# Patient Record
Sex: Male | Born: 1985
Health system: Southern US, Community
[De-identification: ages and names within clinical notes are randomized; demographics above are authoritative.]

## PROBLEM LIST (undated history)

## (undated) DIAGNOSIS — D6800 Von Willebrand disease, unspecified: Secondary | ICD-10-CM

## (undated) DIAGNOSIS — F419 Anxiety disorder, unspecified: Secondary | ICD-10-CM

## (undated) DIAGNOSIS — G43009 Migraine without aura, not intractable, without status migrainosus: Secondary | ICD-10-CM

## (undated) DIAGNOSIS — J45909 Unspecified asthma, uncomplicated: Secondary | ICD-10-CM

## (undated) DIAGNOSIS — F32A Depression, unspecified: Secondary | ICD-10-CM

## (undated) DIAGNOSIS — F329 Major depressive disorder, single episode, unspecified: Secondary | ICD-10-CM

## (undated) DIAGNOSIS — K219 Gastro-esophageal reflux disease without esophagitis: Secondary | ICD-10-CM

## (undated) HISTORY — DX: Unspecified asthma, uncomplicated: J45.909

## (undated) HISTORY — DX: Migraine without aura, not intractable, without status migrainosus: G43.009

## (undated) HISTORY — DX: Gastro-esophageal reflux disease without esophagitis: K21.9

---
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
## 2007-08-26 ENCOUNTER — Emergency Department (HOSPITAL_COMMUNITY): Admission: EM | Admit: 2007-08-26 | Discharge: 2007-08-26 | Payer: Self-pay | Admitting: Emergency Medicine

## 2007-12-29 ENCOUNTER — Emergency Department (HOSPITAL_COMMUNITY): Admission: EM | Admit: 2007-12-29 | Discharge: 2007-12-29 | Payer: Self-pay | Admitting: Emergency Medicine

## 2008-04-14 ENCOUNTER — Emergency Department (HOSPITAL_COMMUNITY): Admission: EM | Admit: 2008-04-14 | Discharge: 2008-04-14 | Payer: Self-pay | Admitting: Emergency Medicine

## 2011-07-23 LAB — DIFFERENTIAL
Basophils Absolute: 0
Basophils Relative: 0
Eosinophils Absolute: 0.1
Eosinophils Relative: 2
Lymphs Abs: 1.6
Monocytes Absolute: 0.9
Monocytes Relative: 14 — ABNORMAL HIGH
Neutro Abs: 4.2

## 2011-07-23 LAB — CBC
RBC: 4.71
RDW: 12.6

## 2014-03-25 ENCOUNTER — Encounter (HOSPITAL_COMMUNITY): Payer: Self-pay | Admitting: Emergency Medicine

## 2014-03-25 ENCOUNTER — Emergency Department (HOSPITAL_COMMUNITY): Payer: 59

## 2014-03-25 ENCOUNTER — Emergency Department (HOSPITAL_COMMUNITY)
Admission: EM | Admit: 2014-03-25 | Discharge: 2014-03-25 | Disposition: A | Discharge status: Home / Self Care | Payer: 59 | Attending: Emergency Medicine | Admitting: Emergency Medicine

## 2014-03-25 DIAGNOSIS — J45909 Unspecified asthma, uncomplicated: Secondary | ICD-10-CM | POA: Insufficient documentation

## 2014-03-25 DIAGNOSIS — R079 Chest pain, unspecified: Secondary | ICD-10-CM | POA: Insufficient documentation

## 2014-03-25 DIAGNOSIS — Z862 Personal history of diseases of the blood and blood-forming organs and certain disorders involving the immune mechanism: Secondary | ICD-10-CM | POA: Insufficient documentation

## 2014-03-25 DIAGNOSIS — R110 Nausea: Secondary | ICD-10-CM | POA: Insufficient documentation

## 2014-03-25 DIAGNOSIS — J45901 Unspecified asthma with (acute) exacerbation: Secondary | ICD-10-CM | POA: Insufficient documentation

## 2014-03-25 HISTORY — DX: Unspecified asthma, uncomplicated: J45.909

## 2014-03-25 HISTORY — DX: Von Willebrand disease, unspecified: D68.00

## 2014-03-25 HISTORY — DX: Von Willebrand's disease: D68.0

## 2014-03-25 LAB — BASIC METABOLIC PANEL
BUN: 11 mg/dL (ref 6–23)
CO2: 22 mEq/L (ref 19–32)
Calcium: 9.4 mg/dL (ref 8.4–10.5)
Chloride: 104 mEq/L (ref 96–112)
Creatinine, Ser: 1.05 mg/dL (ref 0.50–1.35)
GFR calc non Af Amer: >90 mL/min (ref >90)
Glucose, Bld: 121 mg/dL — ABNORMAL HIGH (ref 70–99)
Potassium: 4.4 mEq/L (ref 3.7–5.3)
SODIUM: 141 meq/L (ref 137–147)

## 2014-03-25 LAB — CBC
HCT: 41.5 % (ref 39.0–52.0)
Hemoglobin: 14.3 g/dL (ref 13.0–17.0)
MCH: 29.4 pg (ref 26.0–34.0)
MCHC: 34.5 g/dL (ref 30.0–36.0)
MCV: 85.4 fL (ref 78.0–100.0)
PLATELETS: 284 10*3/uL (ref 150–400)
RBC: 4.86 MIL/uL (ref 4.22–5.81)
RDW: 12.9 % (ref 11.5–15.5)
WBC: 7.9 10*3/uL (ref 4.0–10.5)

## 2014-03-25 LAB — I-STAT TROPONIN, ED
TROPONIN I, POC: 0 ng/mL (ref 0.00–0.08)
TROPONIN I, POC: 0 ng/mL (ref 0.00–0.08)

## 2014-03-25 MED ORDER — ASPIRIN 81 MG PO CHEW
324.0000 mg | CHEWABLE_TABLET | Freq: Once | ORAL | Status: AC
  Administered 2014-03-25: 324 mg via ORAL
  Filled 2014-03-25: qty 4

## 2014-03-25 MED ORDER — OXYCODONE-ACETAMINOPHEN 5-325 MG PO TABS: 1.0000 | ORAL_TABLET | ORAL | Status: DC | PRN

## 2014-03-25 NOTE — Discharge Instructions (Signed)

## 2014-03-25 NOTE — ED Notes (Signed)
Pt c/o CP x 2 weeks; has seen PCP for symptoms, had ekg and labs completed in office. Reports today CP radiated into L arm and neck associated with nausea. Also sts some SOB, but thinks that may be associated with asthma. Denies recent travel.

## 2014-03-25 NOTE — ED Notes (Signed)
Pt st's started having left chest pain over a week ago.  St's he was seen by his MD and had EKG and lab work drawn.  Pt st's chest pain has continued off and on since then.  St's today was having chest pain and became lightheaded and nauseous.

## 2014-03-25 NOTE — ED Provider Notes (Signed)
CSN: [id number]     Arrival date & time 03/25/14  1746 History   First MD Initiated Contact with Patient 03/25/14 1811     Chief Complaint  Patient presents with  . Chest Pain      Patient is a 28 y.o. male presenting with chest pain. The history is provided by the patient.  Chest Pain Pain location:  L chest Pain quality: aching   Pain radiates to:  L shoulder and neck Pain severity:  Moderate Onset quality:  Gradual Duration:  2 weeks Timing:  Intermittent Progression:  Worsening Chronicity:  New Relieved by:  Nothing Worsened by:  Nothing tried Associated symptoms: nausea and shortness of breath   Associated symptoms: no abdominal pain, no cough, no fever, no syncope and not vomiting   pt reports for past 2 weeks he has had episodes of left CP Episodes will last for up to several hours It is not exertional and not pleuritic He denies h/o CAD/PE/DVT He reports he may have hyperlipidemia He does not smoke   fam hx - negative for CAD  Past Medical History  Diagnosis Date  . Asthma   . Von Willebrand disease     History  Substance Use Topics  . Smoking status: Never Smoker   . Smokeless tobacco: Not on file  . Alcohol Use: Yes    Review of Systems  Constitutional: Negative for fever.  Respiratory: Positive for shortness of breath. Negative for cough.   Cardiovascular: Positive for chest pain. Negative for syncope.  Gastrointestinal: Positive for nausea. Negative for vomiting and abdominal pain.  Neurological: Negative for syncope.  All other systems reviewed and are negative.     Allergies  Review of patient's allergies indicates not on file.  Home Medications   Prior to Admission medications   Not on File   BP 141/55  Pulse 84  Temp(Src) 99 F (37.2 C) (Oral)  Resp 20  Ht 6\' 1"  (1.854 m)  Wt 255 lb (115.667 kg)  BMI 33.65 kg/m2  SpO2 100% Physical Exam CONSTITUTIONAL: Well developed/well nourished HEAD: Normocephalic/atraumatic EYES:  EOMI/PERRL ENMT: Mucous membranes moist NECK: supple no meningeal signs SPINE:entire spine nontender CV: S1/S2 noted, no murmurs/rubs/gallops noted LUNGS: Lungs are clear to auscultation bilaterally, no apparent distress ABDOMEN: soft, nontender, no rebound or guarding GU:no cva tenderness NEURO: Pt is awake/alert, moves all extremitiesx4 EXTREMITIES: pulses normal, full ROM, no calf tenderness, no LE edema SKIN: warm, color normal PSYCH: no abnormalities of mood noted  ED Course  Procedures  7:04 PM Pt reports CP for 2 weeks intermittently He reports seen by PCP in [place] and had imaging/labs but results not known and unable to access at this time HEART score less than 3 I doubt PE (no hypoxia, no tachycardia, no pleuritic pain) He is well appearing Will repeat ekg/troponin at 3hr interval 10:39 PM Pt improved No complaints He is well appearing Stable for d/c home I doubt ACS/PE/Dissection at this time BP 118/70  Pulse 68  Temp(Src) 98 F (36.7 C) (Oral)  Resp 18  Ht 6\' 1"  (1.854 m)  Wt 255 lb (115.667 kg)  BMI 33.65 kg/m2  SpO2 99%  Labs Review Labs Reviewed  BASIC METABOLIC PANEL - Abnormal; Notable for the following:    Glucose, Bld 121 (*)    All other components within normal limits  CBC  I-STAT TROPOININ, ED    Imaging Review Dg Chest Port 1 View  03/25/2014   CLINICAL DATA:  Left-sided chest pain  EXAM:  PORTABLE CHEST - 1 VIEW  COMPARISON:  None.  FINDINGS: Normal mediastinum and cardiac silhouette. Normal pulmonary vasculature. No evidence of effusion, infiltrate, or pneumothorax. No acute bony abnormality.  IMPRESSION: Normal chest radiograph.   Electronically Signed   By: Genevive BiStewart  Edmunds M.D.   On: 03/25/2014 18:32     EKG Interpretation   Date/Time:  Sunday Mar 25 2014 18:04:50 EDT Ventricular Rate:  81 PR Interval:  146 QRS Duration: 102 QT Interval:  369 QTC Calculation: 428 R Axis:   96 Text Interpretation:  Sinus rhythm Consider  right ventricular hypertrophy  Probable inferior infarct, old Baseline wander in lead(s) I III aVR aVL  aVF artifact noted Confirmed by Bebe ShaggyWICKLINE  MD, Isiaha Greenup ([id number]) on 03/25/2014  6:24:14 PM      Date: 03/25/2014 1751  Rate: 92  Rhythm: normal sinus rhythm  QRS Axis: normal  Intervals: normal  ST/T Wave abnormalities: normal  Conduction Disutrbances:none   EKG Interpretation  Date/Time:  Sunday Mar 25 2014 21:48:41 EDT Ventricular Rate:  62 PR Interval:  150 QRS Duration: 104 QT Interval:  405 QTC Calculation: 411 R Axis:   90 Text Interpretation:  Sinus rhythm Consider right ventricular hypertrophy No significant change since last tracing Confirmed by Bebe ShaggyWICKLINE  MD, Dorinda HillNALD ([id number]) on 03/25/2014 10:13:38 PM        MDM   Final diagnoses:  Chest pain    Nursing notes including past medical history and social history reviewed and considered in documentation xrays reviewed and considered Labs/vital reviewed and considered     Joya Gaskinsonald W Antoinette Haskett, MD 03/25/14 2239

## 2015-02-27 ENCOUNTER — Encounter: Payer: Self-pay | Admitting: Internal Medicine

## 2015-02-27 ENCOUNTER — Ambulatory Visit (INDEPENDENT_AMBULATORY_CARE_PROVIDER_SITE_OTHER): Payer: 59 | Admitting: Internal Medicine

## 2015-02-27 VITALS — BP 120/80 | HR 87 | Temp 98.0°F | Ht 73.0 in | Wt 267.0 lb

## 2015-02-27 DIAGNOSIS — J4520 Mild intermittent asthma, uncomplicated: Secondary | ICD-10-CM | POA: Diagnosis not present

## 2015-02-27 DIAGNOSIS — G43009 Migraine without aura, not intractable, without status migrainosus: Secondary | ICD-10-CM | POA: Insufficient documentation

## 2015-02-27 DIAGNOSIS — J45909 Unspecified asthma, uncomplicated: Secondary | ICD-10-CM | POA: Insufficient documentation

## 2015-02-27 DIAGNOSIS — K2100 Gastro-esophageal reflux disease with esophagitis, without bleeding: Secondary | ICD-10-CM

## 2015-02-27 DIAGNOSIS — Z0000 Encounter for general adult medical examination without abnormal findings: Secondary | ICD-10-CM | POA: Diagnosis not present

## 2015-02-27 DIAGNOSIS — D6800 Von Willebrand disease, unspecified: Secondary | ICD-10-CM | POA: Insufficient documentation

## 2015-02-27 DIAGNOSIS — K219 Gastro-esophageal reflux disease without esophagitis: Secondary | ICD-10-CM | POA: Insufficient documentation

## 2015-02-27 MED ORDER — MONTELUKAST SODIUM 10 MG PO TABS: 10.0000 mg | ORAL_TABLET | Freq: Every day | ORAL | Status: DC

## 2015-02-27 NOTE — Assessment & Plan Note (Signed)
Seems to be cause of persistent chest pain Will increase the omeprazole to bid Change to another PPI if persistent symptoms

## 2015-02-27 NOTE — Progress Notes (Signed)
Subjective:    Patient ID: Eric Norris, male    DOB: Aug 05, 1986, 29 y.o.   MRN: [id number]  HPI Here to establish and for physical  Having some bad episodes of chest pain for the past year Has had CXR, stress test---all negative Intermittent but every day Goes along with a sour stomach Gets bad substernal heartburn if he misses the omeprazole Doesn't awaken him but present most mornings No trouble swallowing  Not much exercise Has gained 80# since starting at [employer] No longer doing physical work  Asthma history Has nebulizer from the past Rarely needs the albuterol in summer Uses the albuterol regularly when it is cold--every day (discussed using spacer) Cats also get him going  Current Outpatient Prescriptions on File Prior to Visit  Medication Sig Dispense Refill  . albuterol (PROAIR HFA) 108 (90 BASE) MCG/ACT inhaler Inhale 2 puffs into the lungs 2 (two) times daily as needed for wheezing or shortness of breath.    Marland Kitchen. albuterol (PROVENTIL) (2.5 MG/3ML) 0.083% nebulizer solution Take 0.625-1.25 mg by nebulization daily as needed for wheezing or shortness of breath.     No current facility-administered medications on file prior to visit.    No Known Allergies  Past Medical History  Diagnosis Date  . Asthma   . Von Willebrand disease   . GERD (gastroesophageal reflux disease)   . Cold-induced asthma   . Von Willebrand disease     No past surgical history on file.  Family History  Problem Relation Age of Onset  . Ulcerative colitis Sister     History   Social History  . Marital Status: Married    Spouse Name: N/A  . Number of Children: 1  . Years of Education: N/A   Occupational History  . Service technician     [employer]   Social History Main Topics  . Smoking status: Never Smoker   . Smokeless tobacco: Never Used  . Alcohol Use: 0.0 oz/week    0 Standard drinks or equivalent per week  . Drug Use: No  . Sexual Activity: Not on file    Other Topics Concern  . Not on file   Social History Narrative   Review of Systems  Constitutional: Positive for unexpected weight change. Negative for fatigue.       Wears seat belt  HENT: Negative for dental problem, hearing loss, tinnitus and trouble swallowing.        Overdue for dentist  Eyes: Negative for visual disturbance.       No diplopia or unilateral vision loss  Respiratory: Negative for cough, chest tightness and shortness of breath.   Cardiovascular: Positive for chest pain. Negative for palpitations and leg swelling.  Gastrointestinal: Negative for nausea, vomiting, abdominal pain, constipation and blood in stool.  Genitourinary: Negative for urgency, frequency and difficulty urinating.       No sexual problems  Musculoskeletal: Positive for back pain. Negative for joint swelling and arthralgias.  Skin: Negative for rash.  Allergic/Immunologic: Positive for environmental allergies. Negative for immunocompromised state.       Animals  Neurological: Positive for headaches. Negative for dizziness, syncope, weakness, light-headedness and numbness.       Occasional migraine---Goody's and lying in dark will help Restless legs at times--compression socks helps  Psychiatric/Behavioral: Negative for sleep disturbance and dysphoric mood. The patient is not nervous/anxious.        Objective:   Physical Exam  Constitutional: He is oriented to person, place,  and time. He appears well-developed and well-nourished. No distress.  HENT:  Head: Normocephalic and atraumatic.  Right Ear: External ear normal.  Left Ear: External ear normal.  Mouth/Throat: Oropharynx is clear and moist. No oropharyngeal exudate.  Eyes: Conjunctivae and EOM are normal. Pupils are equal, round, and reactive to light.  Neck: Normal range of motion. Neck supple. No thyromegaly present.  Cardiovascular: Normal rate, regular rhythm, normal heart sounds and intact distal pulses.  Exam reveals no gallop.    No murmur heard. Pulmonary/Chest: Effort normal and breath sounds normal. No respiratory distress. He has no wheezes. He has no rales.  Abdominal: Soft. There is no tenderness.  Musculoskeletal: He exhibits no edema or tenderness.  Lymphadenopathy:    He has no cervical adenopathy.  Neurological: He is alert and oriented to person, place, and time.  Skin: No rash noted. No erythema.  Psychiatric: He has a normal mood and affect. His behavior is normal.          Assessment & Plan:

## 2015-02-27 NOTE — Assessment & Plan Note (Signed)
Healthy but needs to work on fitness 

## 2015-02-27 NOTE — Assessment & Plan Note (Signed)
Uses the albuterol a lot in cold weather Will try montelukast for then (or pet exposure)

## 2015-02-27 NOTE — Patient Instructions (Signed)
Please try the montelukast nightly during the winter--or if you are having cold exposure. Increase the omeprazole to twice a day--in the morning on an empty stomach and at bedtime. Don't eat close to bedtime and raise the head of your bed. If the chest pain is no better within a month, let me know and I will change your medication.

## 2015-02-27 NOTE — Progress Notes (Signed)
Pre visit review using our clinic review tool, if applicable. No additional management support is needed unless otherwise documented below in the visit note. 

## 2015-04-06 ENCOUNTER — Encounter (HOSPITAL_COMMUNITY): Payer: Self-pay | Admitting: Emergency Medicine

## 2015-04-06 ENCOUNTER — Emergency Department (HOSPITAL_COMMUNITY)
Admission: EM | Admit: 2015-04-06 | Discharge: 2015-04-07 | Disposition: A | Discharge status: Home / Self Care | Payer: 59 | Attending: Emergency Medicine | Admitting: Emergency Medicine

## 2015-04-06 DIAGNOSIS — Z8679 Personal history of other diseases of the circulatory system: Secondary | ICD-10-CM | POA: Diagnosis not present

## 2015-04-06 DIAGNOSIS — J45909 Unspecified asthma, uncomplicated: Secondary | ICD-10-CM | POA: Diagnosis not present

## 2015-04-06 DIAGNOSIS — Z79899 Other long term (current) drug therapy: Secondary | ICD-10-CM | POA: Insufficient documentation

## 2015-04-06 DIAGNOSIS — F419 Anxiety disorder, unspecified: Secondary | ICD-10-CM | POA: Diagnosis not present

## 2015-04-06 DIAGNOSIS — R079 Chest pain, unspecified: Secondary | ICD-10-CM | POA: Insufficient documentation

## 2015-04-06 DIAGNOSIS — Z862 Personal history of diseases of the blood and blood-forming organs and certain disorders involving the immune mechanism: Secondary | ICD-10-CM | POA: Diagnosis not present

## 2015-04-06 DIAGNOSIS — K219 Gastro-esophageal reflux disease without esophagitis: Secondary | ICD-10-CM | POA: Diagnosis not present

## 2015-04-06 DIAGNOSIS — M542 Cervicalgia: Secondary | ICD-10-CM | POA: Diagnosis not present

## 2015-04-06 LAB — I-STAT TROPONIN, ED: Troponin i, poc: 0 ng/mL (ref 0.00–0.08)

## 2015-04-06 LAB — CBC WITH DIFFERENTIAL/PLATELET
Basophils Absolute: 0.1 10*3/uL (ref 0.0–0.1)
Basophils Relative: 1 % (ref 0–1)
Eosinophils Absolute: 0.4 10*3/uL (ref 0.0–0.7)
Eosinophils Relative: 5 % (ref 0–5)
HEMATOCRIT: 42 % (ref 39.0–52.0)
Hemoglobin: 14.3 g/dL (ref 13.0–17.0)
LYMPHS ABS: 4.1 10*3/uL — AB (ref 0.7–4.0)
LYMPHS PCT: 44 % (ref 12–46)
MCH: 28.9 pg (ref 26.0–34.0)
MCHC: 34 g/dL (ref 30.0–36.0)
MCV: 85 fL (ref 78.0–100.0)
Monocytes Absolute: 0.6 10*3/uL (ref 0.1–1.0)
Monocytes Relative: 7 % (ref 3–12)
NEUTROS ABS: 3.9 10*3/uL (ref 1.7–7.7)
Neutrophils Relative %: 43 % (ref 43–77)
PLATELETS: 321 10*3/uL (ref 150–400)
RBC: 4.94 MIL/uL (ref 4.22–5.81)
RDW: 13 % (ref 11.5–15.5)
WBC: 9.1 10*3/uL (ref 4.0–10.5)

## 2015-04-06 LAB — BASIC METABOLIC PANEL
Anion gap: 11 (ref 5–15)
BUN: 15 mg/dL (ref 6–20)
CHLORIDE: 99 mmol/L — AB (ref 101–111)
CO2: 25 mmol/L (ref 22–32)
Calcium: 8.8 mg/dL — ABNORMAL LOW (ref 8.9–10.3)
Creatinine, Ser: 1.1 mg/dL (ref 0.61–1.24)
GFR calc Af Amer: >60 mL/min (ref >60)
GFR calc non Af Amer: >60 mL/min (ref >60)
Glucose, Bld: 107 mg/dL — ABNORMAL HIGH (ref 65–99)
Potassium: 3.7 mmol/L (ref 3.5–5.1)
Sodium: 135 mmol/L (ref 135–145)

## 2015-04-06 MED ORDER — SUCRALFATE 1 G PO TABS
1.0000 g | ORAL_TABLET | Freq: Once | ORAL | Status: AC
  Administered 2015-04-06: 1 g via ORAL
  Filled 2015-04-06: qty 1

## 2015-04-06 NOTE — ED Provider Notes (Signed)
CSN: [id number]     Arrival date & time 04/06/15  2233 History  This chart was scribed for non-physician practitioner, Arman Filter, NP, working with Samuel Jester, DO, by Roxy Cedar ED Scribe. This patient was seen in room A01C/A01C and the patient's care was started at 11:11 PM   Chief Complaint  Patient presents with  . Chest Pain    The patient said he has had chest pain for a year. Today it is different, he has nausea, dizziness, lightheadedness, neck pain.     Patient is a 29 y.o. male presenting with chest pain. The history is provided by the patient. No language interpreter was used.  Chest Pain Associated symptoms: nausea    HPI Comments: Eric Norris is a 29 y.o. male with a PMHx of asthma, GERD and Von Willebrand disease, who presents to the Emergency Department complaining of moderate "continuous" chest pain that began 1 year ago. He states that his left sided chest pain today was different. He states he took BC powder at 9pm today and reports associated onset of neck pain and nausea. Patient is seen by Dr. Providence Lanius and is prescribed omeprazole medication for GERD which he takes bid. He states that his symptoms today feel different than at baseline. He reports that he was unable to sleep tonight due to increased chest pain. He states he has had an EKG and heart stress test in the past year. Temperature or heat are not modifying factors. 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   Past Medical History  Diagnosis Date  . Asthma   . Von Willebrand disease   . GERD (gastroesophageal reflux disease)   . Cold-induced asthma   . Von Willebrand disease   . Migraine without aura    History reviewed. No pertinent past surgical history. Family History  Problem Relation Age of Onset  . Ulcerative colitis Sister    History  Substance Use Topics  . Smoking status: Never Smoker   . Smokeless tobacco: Never Used  . Alcohol Use:  0.0 oz/week    0 Standard drinks or equivalent per week   Review of Systems  Cardiovascular: Positive for chest pain.  Gastrointestinal: Positive for nausea.  Musculoskeletal: Positive for neck pain.  All other systems reviewed and are negative.  Allergies  Review of patient's allergies indicates no known allergies.  Home Medications   Prior to Admission medications   Medication Sig Start Date End Date Taking? Authorizing Provider  albuterol (PROAIR HFA) 108 (90 BASE) MCG/ACT inhaler Inhale 2 puffs into the lungs 2 (two) times daily as needed for wheezing or shortness of breath.   Yes Historical Provider, MD  albuterol (PROVENTIL) (2.5 MG/3ML) 0.083% nebulizer solution Take 0.625-1.25 mg by nebulization daily as needed for wheezing or shortness of breath.   Yes Historical Provider, MD  omeprazole (PRILOSEC) 20 MG capsule Take 20 mg by mouth 2 (two) times daily.  02/17/15  Yes Historical Provider, MD  montelukast (SINGULAIR) 10 MG tablet Take 1 tablet (10 mg total) by mouth at bedtime. Patient not taking: Reported on 04/06/2015 02/27/15   Karie Schwalbe, MD  sucralfate (CARAFATE) 1 G tablet Take 1 tablet (1 g total) by mouth 4 (four) times daily -  with meals and at bedtime. 04/07/15   Earley Favor, NP   Triage Vitals: BP 135/75 mmHg  Pulse 85  Temp(Src) 98.2 F (36.8 C) (Oral)  Resp 16  Ht 6\' 1"  (1.854  m)  Wt 267 lb 1.6 oz (121.156 kg)  BMI 35.25 kg/m2  SpO2 98%  Physical Exam  Constitutional: He is oriented to person, place, and time. He appears well-developed and well-nourished. No distress.  HENT:  Head: Normocephalic and atraumatic.  Eyes: Conjunctivae and EOM are normal.  Neck: Neck supple. No tracheal deviation present.  Cardiovascular: Normal rate, regular rhythm and normal heart sounds.   Pulmonary/Chest: Effort normal and breath sounds normal. No respiratory distress.  Abdominal: Soft. Bowel sounds are normal.  Musculoskeletal: Normal range of motion. He exhibits no  edema.  Neurological: He is alert and oriented to person, place, and time.  Skin: Skin is warm and dry.  Psychiatric: He has a normal mood and affect. His behavior is normal.  Nursing note and vitals reviewed.  ED Course  Procedures (including critical care time)  DIAGNOSTIC STUDIES: Oxygen Saturation is 98% on RA, normal by my interpretation.    COORDINATION OF CARE: 11:18 PM- Discussed plans to order diagnostic EKG and lab work. Pt advised of plan for treatment and pt agrees.  Labs Review Labs Reviewed  BASIC METABOLIC PANEL - Abnormal; Notable for the following:    Chloride 99 (*)    Glucose, Bld 107 (*)    Calcium 8.8 (*)    All other components within normal limits  CBC WITH DIFFERENTIAL/PLATELET - Abnormal; Notable for the following:    Lymphs Abs 4.1 (*)    All other components within normal limits  I-STAT TROPOININ, ED   Imaging Review No results found.   EKG Interpretation   Date/Time:  Saturday April 06 2015 22:37:50 EDT Ventricular Rate:  92 PR Interval:  134 QRS Duration: 94 QT Interval:  366 QTC Calculation: 452 R Axis:   79 Text Interpretation:  Normal sinus rhythm Normal ECG When compared with  ECG of 03/25/2014 No significant change was found Confirmed by [facility]   MD, Nicholos Johns [phone]) on 04/06/2015 11:58:23 PM    refused chest xray feels better after Carafate Will FU with PCP MDM   Final diagnoses:  Chest pain  Anxiety  Gastroesophageal reflux disease without esophagitis    I personally performed the services described in this documentation, which was scribed in my presence. The recorded information has been reviewed and is accurate.  Earley Favor, NP 04/07/15 0150  Samuel Jester, DO 04/10/15 1424

## 2015-04-06 NOTE — ED Notes (Signed)
The patient said he has had chest pain for a year. Today it is different, he has nausea, dizziness, lightheadedness, neck pain.   The patient said today the chest pain is different and that is why he is here.  He rate his pain 3/10.

## 2015-04-07 MED ORDER — SUCRALFATE 1 G PO TABS: 1.0000 g | ORAL_TABLET | Freq: Three times a day (TID) | ORAL | Status: DC

## 2015-04-07 NOTE — ED Notes (Signed)
Pt. Refused Xray, states he will follow up with PCP because he thinks it was just an anxiety attack

## 2015-04-07 NOTE — Discharge Instructions (Signed)
Chest Pain (Nonspecific) °It is often hard to give a specific diagnosis for the cause of chest pain. There is always a chance that your pain could be related to something serious, such as a heart attack or a blood clot in the lungs. You need to follow up with your health care provider for further evaluation. °CAUSES  °· Heartburn. °· Pneumonia or bronchitis. °· Anxiety or stress. °· Inflammation around your heart (pericarditis) or lung (pleuritis or pleurisy). °· A blood clot in the lung. °· A collapsed lung (pneumothorax). It can develop suddenly on its own (spontaneous pneumothorax) or from trauma to the chest. °· Shingles infection (herpes zoster virus). °The chest wall is composed of bones, muscles, and cartilage. Any of these can be the source of the pain. °· The bones can be bruised by injury. °· The muscles or cartilage can be strained by coughing or overwork. °· The cartilage can be affected by inflammation and become sore (costochondritis). °DIAGNOSIS  °Lab tests or other studies may be needed to find the cause of your pain. Your health care provider may have you take a test called an ambulatory electrocardiogram (ECG). An ECG records your heartbeat patterns over a 24-hour period. You may also have other tests, such as: °· Transthoracic echocardiogram (TTE). During echocardiography, sound waves are used to evaluate how blood flows through your heart. °· Transesophageal echocardiogram (TEE). °· Cardiac monitoring. This allows your health care provider to monitor your heart rate and rhythm in real time. °· Holter monitor. This is a portable device that records your heartbeat and can help diagnose heart arrhythmias. It allows your health care provider to track your heart activity for several days, if needed. °· Stress tests by exercise or by giving medicine that makes the heart beat faster. °TREATMENT  °· Treatment depends on what may be causing your chest pain. Treatment may include: °¨ Acid blockers for  heartburn. °¨ Anti-inflammatory medicine. °¨ Pain medicine for inflammatory conditions. °¨ Antibiotics if an infection is present. °· You may be advised to change lifestyle habits. This includes stopping smoking and avoiding alcohol, caffeine, and chocolate. °· You may be advised to keep your head raised (elevated) when sleeping. This reduces the chance of acid going backward from your stomach into your esophagus. °Most of the time, nonspecific chest pain will improve within 2-3 days with rest and mild pain medicine.  °HOME CARE INSTRUCTIONS  °· If antibiotics were prescribed, take them as directed. Finish them even if you start to feel better. °· For the next few days, avoid physical activities that bring on chest pain. Continue physical activities as directed. °· Do not use any tobacco products, including cigarettes, chewing tobacco, or electronic cigarettes. °· Avoid drinking alcohol. °· Only take medicine as directed by your health care provider. °· Follow your health care provider's suggestions for further testing if your chest pain does not go away. °· Keep any follow-up appointments you made. If you do not go to an appointment, you could develop lasting (chronic) problems with pain. If there is any problem keeping an appointment, call to reschedule. °SEEK MEDICAL CARE IF:  °· Your chest pain does not go away, even after treatment. °· You have a rash with blisters on your chest. °· You have a fever. °SEEK IMMEDIATE MEDICAL CARE IF:  °· You have increased chest pain or pain that spreads to your arm, neck, jaw, back, or abdomen. °· You have shortness of breath. °· You have an increasing cough, or you cough   up blood.  You have severe back or abdominal pain.  You feel nauseous or vomit.  You have severe weakness.  You faint.  You have chills. This is an emergency. Do not wait to see if the pain will go away. Get medical help at once. Call your local emergency services (911 in [place] Do not drive  yourself to the hospital. MAKE SURE YOU:   Understand these instructions.  Will watch your condition.  Will get help right away if you are not doing well or get worse. Document Released: 07/22/2005 Document Revised: 10/17/2013 Document Reviewed: 05/17/2008 [facility] Patient Information 2015 [place], [place]. This information is not intended to replace advice given to you by your health care provider. Make sure you discuss any questions you have with your health care provider. Try the Carafate on a regular basis for the next week to see if your symptoms improve  If you do this stop the Prilosec Follow up with your PCP

## 2015-04-07 NOTE — ED Notes (Signed)
Pt. Left with all belongings and refused wheelchair 

## 2015-04-08 ENCOUNTER — Ambulatory Visit: Payer: 59 | Admitting: Primary Care

## 2015-04-12 ENCOUNTER — Ambulatory Visit: Payer: 59 | Admitting: Internal Medicine

## 2015-07-30 ENCOUNTER — Ambulatory Visit (INDEPENDENT_AMBULATORY_CARE_PROVIDER_SITE_OTHER): Payer: Commercial Managed Care - HMO

## 2015-07-30 DIAGNOSIS — Z23 Encounter for immunization: Secondary | ICD-10-CM | POA: Diagnosis not present

## 2015-10-09 ENCOUNTER — Telehealth: Payer: Self-pay | Admitting: Internal Medicine

## 2015-10-09 ENCOUNTER — Encounter: Payer: Self-pay | Admitting: Primary Care

## 2015-10-09 ENCOUNTER — Ambulatory Visit (INDEPENDENT_AMBULATORY_CARE_PROVIDER_SITE_OTHER): Payer: Commercial Managed Care - HMO | Admitting: Primary Care

## 2015-10-09 VITALS — BP 122/76 | HR 88 | Temp 98.4°F | Ht 73.0 in | Wt 277.8 lb

## 2015-10-09 DIAGNOSIS — F411 Generalized anxiety disorder: Secondary | ICD-10-CM

## 2015-10-09 DIAGNOSIS — R079 Chest pain, unspecified: Secondary | ICD-10-CM | POA: Diagnosis not present

## 2015-10-09 MED ORDER — ESCITALOPRAM OXALATE 10 MG PO TABS: 10.0000 mg | ORAL_TABLET | Freq: Every day | ORAL | Status: DC

## 2015-10-09 NOTE — Patient Instructions (Addendum)
Your ECG looks good and is unchanged from your prior ECG.  Start Lexapro tablets for anxiety. Take 1/2 tablet by mouth daily for 6 days, then advance to 1 full tablet thereafter.  It takes 4-6 weeks for this medication to take full effect, but you should notice a difference in the next several days.  Please follow up with either myself or your PCP in 4 to 6 weeks.  It was a pleasure meeting you!

## 2015-10-09 NOTE — Progress Notes (Signed)
Pre visit review using our clinic review tool, if applicable. No additional management support is needed unless otherwise documented below in the visit note. 

## 2015-10-09 NOTE — Telephone Encounter (Signed)
Patient Name: Eric Norris  DOB: Aug 21, 1986    Initial Comment Caller states he is having chest pain. Sometimes goes into his neck and arm. Having stomach issues.   Nurse Assessment  Nurse: Vickey SagesAtkins, RN, Jacquilin Date/Time (Eastern Time): 10/09/2015 9:02:09 AM  Confirm and document reason for call. If symptomatic, describe symptoms. ---Caller states he is having chest pain. Sometimes goes into his neck and arm. Having stomach issues.- Caller states he has had chest pain for 2+ years. Pain started going into his arm/neck about 2-3 months ago. Dizziness off and on X months with the chest pain. Patient states he has been seen for the issue but its not getting better  Has the patient traveled out of the country within the last 30 days? ---Not Applicable  Does the patient have any new or worsening symptoms? ---Yes  Will a triage be completed? ---Yes  Related visit to physician within the last 2 weeks? ---No  Does the PT have any chronic conditions? (i.e. diabetes, asthma, etc.) ---Yes  List chronic conditions. ---asthma  Is this a behavioral health or substance abuse call? ---No

## 2015-10-09 NOTE — Progress Notes (Signed)
Subjective:    Patient ID: Eric Norris, male    DOB: [birth year], 30 y.o.   MRN: [id number]  HPI  Mr. Gignac is a 29 year old male with a history of asthma, GERD, and Von Willlebrand disease who presents today with a chief complaint of chest pain. His chest pain has been intermittent for several years and is located to the anterior chest wall bilaterally.   He's sought treatment for his symptoms in the past.  He also has pain to his shoulders and neck that has been present intermittently since this summer. He describes his pain as "squeezing/pressure", sometimes sharp, sometimes dull.   His most recent episode began last night. He began to feel lightheaded and dizzy this morning.  He's had some nausea and abdominal discomfort. He's had numerous ECG's that were unremarkable. He completed a stress test that was in August 2016 through [facility] which was normal [per patient.  His chest pain causes him to worry daily. He endorses difficulty controlling his worry, has irritability. GAD 7 score of 21 today. He works as a Scientist, water quality. He was seeing a therapist 2-3 months ago and didn't feel as though it helped. He denies depression symptoms.  Review of Systems  Respiratory: Negative for shortness of breath.   Cardiovascular: Positive for chest pain. Negative for palpitations.  Gastrointestinal: Positive for nausea. Negative for vomiting.  Neurological: Negative for headaches.  Psychiatric/Behavioral: Positive for sleep disturbance. Negative for suicidal ideas. The patient is nervous/anxious.        Past Medical History  Diagnosis Date  . Asthma   . Von Willebrand disease (HCC)   . GERD (gastroesophageal reflux disease)   . Cold-induced asthma   . Von Willebrand disease (HCC)   . Migraine without aura     Social History   Social History  . Marital Status: Married    Spouse Name: N/A  . Number of Children: 1  . Years of Education: N/A    Occupational History  . Service technician     [employer]   Social History Main Topics  . Smoking status: Never Smoker   . Smokeless tobacco: Never Used  . Alcohol Use: 0.0 oz/week    0 Standard drinks or equivalent per week  . Drug Use: No  . Sexual Activity: Not on file   Other Topics Concern  . Not on file   Social History Narrative    No past surgical history on file.  Family History  Problem Relation Age of Onset  . Ulcerative colitis Sister     No Known Allergies  Current Outpatient Prescriptions on File Prior to Visit  Medication Sig Dispense Refill  . albuterol (PROAIR HFA) 108 (90 BASE) MCG/ACT inhaler Inhale 2 puffs into the lungs 2 (two) times daily as needed for wheezing or shortness of breath.    Marland Kitchen albuterol (PROVENTIL) (2.5 MG/3ML) 0.083% nebulizer solution Take 0.625-1.25 mg by nebulization daily as needed for wheezing or shortness of breath.    . montelukast (SINGULAIR) 10 MG tablet Take 1 tablet (10 mg total) by mouth at bedtime. 30 tablet 11  . omeprazole (PRILOSEC) 20 MG capsule Take 20 mg by mouth 2 (two) times daily.      No current facility-administered medications on file prior to visit.    BP 122/76 mmHg  Pulse 88  Temp(Src) 98.4 F (36.9 C) (Oral)  Ht  (1.854 m)  Wt 277 lb 12.8 oz (126.009 kg)  BMI 36.66  kg/m2  SpO2 98%    Objective:   Physical Exam  Constitutional: He appears well-nourished.  Cardiovascular: Normal rate and regular rhythm.   Pulmonary/Chest: Effort normal and breath sounds normal.  Skin: Skin is warm and dry.  Psychiatric: He has a normal mood and affect.          Assessment & Plan:  Chest pain:   Chest pain with abdominal discomfort and nausea intermittently for years, recent episode Monday this week. ECG's reviewed and are unremarkable. ECG today unchanged from prior in June 2016.  Normal stress test in August 2016.  After a long discussion and assessment of symptoms, suspect root cause of  symptoms are related to anxiety. GAD 7 score of 21 today. No depressive symptoms.  Will do TSH, CBC, CMP today to rule out other metabolic causes. Otherwise I highly suspect he has untreated GAD.  ECG: NSR rate of 77. Unchanged when compared to prior ECG in June 2016. No ST elevation/depression, inverted t-waves, PVC, PAC's.

## 2015-10-09 NOTE — Assessment & Plan Note (Signed)
Chest pain with abdominal discomfort and nausea intermittently for years, recent episode Monday this week. ECG's reviewed and are unremarkable. ECG today unchanged from prior in June 2016.  Normal stress test in August 2016.  After a long discussion and assessment of symptoms, suspect root cause of symptoms are related to anxiety. GAD 7 score of 21 today. No depressive symptoms. Will treat with Lexapro 10 mg and therapy.  Patient is to take 1/2 tablet daily for 6 days, then advance to 1 full tablet thereafter. We discussed possible side effects of headache, GI upset, drowsiness, and SI/HI. If thoughts of SI/HI develop, we discussed to present to the emergency immediately. Patient verbalized understanding.   He is to follow up in 4-6 weeks for re-evaluation.

## 2015-10-09 NOTE — Telephone Encounter (Signed)
Spoke with pt; pt had CP for 2 years; pt has had stress testing done. For 5-6 months pt has had pain going into neck and arm. Today pt having CP with pain going into neck; pain level 2-3. Pt cannot come to office until after 2 pm today. Pt scheduled appt with Mayra ReelKate Clark NP today at 2:45 pm. Pt agrees if pain changes or worsens or develops SOB will go to UC or ED for eval. Pt is not completely sure he can make todays appt and will keep appt with Dr Alphonsus SiasLetvak for 10/10/15 until sure will be able to make todays appt.

## 2015-10-10 ENCOUNTER — Ambulatory Visit: Payer: Self-pay | Admitting: Internal Medicine

## 2015-10-10 LAB — COMPREHENSIVE METABOLIC PANEL
ALBUMIN: 4.3 g/dL (ref 3.5–5.2)
ALT: 25 U/L (ref 0–53)
AST: 18 U/L (ref 0–37)
Alkaline Phosphatase: 71 U/L (ref 39–117)
BUN: 13 mg/dL (ref 6–23)
CHLORIDE: 103 meq/L (ref 96–112)
CO2: 28 meq/L (ref 19–32)
CREATININE: 1.12 mg/dL (ref 0.40–1.50)
Calcium: 9.3 mg/dL (ref 8.4–10.5)
GFR: 82.14 mL/min (ref >60.00)
Glucose, Bld: 98 mg/dL (ref 70–99)
Potassium: 4.2 mEq/L (ref 3.5–5.1)
SODIUM: 138 meq/L (ref 135–145)
Total Bilirubin: 0.3 mg/dL (ref 0.2–1.2)
Total Protein: 7.4 g/dL (ref 6.0–8.3)

## 2015-10-10 LAB — CBC
HCT: 44.5 % (ref 39.0–52.0)
Hemoglobin: 14.7 g/dL (ref 13.0–17.0)
MCHC: 33 g/dL (ref 30.0–36.0)
MCV: 86.1 fl (ref 78.0–100.0)
Platelets: 325 10*3/uL (ref 150.0–400.0)
RBC: 5.16 Mil/uL (ref 4.22–5.81)
RDW: 13.6 % (ref 11.5–15.5)
WBC: 10.4 10*3/uL (ref 4.0–10.5)

## 2015-10-10 LAB — TSH: TSH: 3.18 u[IU]/mL (ref 0.35–4.50)

## 2015-10-29 ENCOUNTER — Ambulatory Visit: Payer: Commercial Managed Care - HMO | Admitting: Psychology

## 2015-11-14 ENCOUNTER — Ambulatory Visit: Payer: Commercial Managed Care - HMO | Admitting: Internal Medicine

## 2015-11-18 ENCOUNTER — Ambulatory Visit: Payer: Commercial Managed Care - HMO | Admitting: Internal Medicine

## 2015-11-26 ENCOUNTER — Ambulatory Visit (INDEPENDENT_AMBULATORY_CARE_PROVIDER_SITE_OTHER): Payer: 59 | Admitting: Psychology

## 2015-11-26 DIAGNOSIS — F410 Panic disorder [episodic paroxysmal anxiety] without agoraphobia: Secondary | ICD-10-CM | POA: Diagnosis not present

## 2015-12-02 ENCOUNTER — Encounter: Payer: Self-pay | Admitting: Internal Medicine

## 2015-12-02 ENCOUNTER — Ambulatory Visit (INDEPENDENT_AMBULATORY_CARE_PROVIDER_SITE_OTHER): Payer: Commercial Managed Care - HMO | Admitting: Internal Medicine

## 2015-12-02 VITALS — BP 110/80 | HR 88 | Temp 98.3°F | Wt 273.0 lb

## 2015-12-02 DIAGNOSIS — F411 Generalized anxiety disorder: Secondary | ICD-10-CM | POA: Diagnosis not present

## 2015-12-02 MED ORDER — ESCITALOPRAM OXALATE 10 MG PO TABS: 10.0000 mg | ORAL_TABLET | Freq: Every day | ORAL | Status: DC

## 2015-12-02 NOTE — Progress Notes (Signed)
Pre visit review using our clinic review tool, if applicable. No additional management support is needed unless otherwise documented below in the visit note. 

## 2015-12-02 NOTE — Assessment & Plan Note (Signed)
Has had a good response to the escitalopram. If worsens again--will increase to 20 Continue with Dr Laymond Purser

## 2015-12-02 NOTE — Addendum Note (Signed)
Addended by: Tillman Abide I on: 12/02/2015 04:02 PM   Modules accepted: Orders

## 2015-12-02 NOTE — Progress Notes (Signed)
   Subjective:    Patient ID: Eric Norris, male    DOB: 1985-11-10, 30 y.o.   MRN: [id number]  HPI Here for follow up of chest pain and apparent anxiety Reviewed Ms Clark's note  Thinks he is doing okay Seeing therapist--Dr Laymond Purser. Went once and it went well Has had spells "where I felt out of my body"--- with stress Feels the chest pain could be from the anxiety and sort of a panic attack Wife has noted short temper and other emotional issues--this seems to be better Always tended to be fatalistic--if rolled his ankle, thinks it is broken, etc  Has noticed more control Not as worried on the escitalopram Chest pain is better--but not gone (but doesn't feel like he is going to die when it comes on) Not depressed  Trying to eat better and lose some weight  Current Outpatient Prescriptions on File Prior to Visit  Medication Sig Dispense Refill  . albuterol (PROAIR HFA) 108 (90 BASE) MCG/ACT inhaler Inhale 2 puffs into the lungs 2 (two) times daily as needed for wheezing or shortness of breath.    Marland Kitchen albuterol (PROVENTIL) (2.5 MG/3ML) 0.083% nebulizer solution Take 0.625-1.25 mg by nebulization daily as needed for wheezing or shortness of breath.    . escitalopram (LEXAPRO) 10 MG tablet Take 1 tablet (10 mg total) by mouth daily. 30 tablet 3  . montelukast (SINGULAIR) 10 MG tablet Take 1 tablet (10 mg total) by mouth at bedtime. 30 tablet 11  . omeprazole (PRILOSEC) 20 MG capsule Take 20 mg by mouth 2 (two) times daily.      No current facility-administered medications on file prior to visit.    No Known Allergies  Past Medical History  Diagnosis Date  . Asthma   . Von Willebrand disease (HCC)   . GERD (gastroesophageal reflux disease)   . Cold-induced asthma   . Von Willebrand disease (HCC)   . Migraine without aura     No past surgical history on file.  Family History  Problem Relation Age of Onset  . Ulcerative colitis Sister     Social History   Social History    . Marital Status: Married    Spouse Name: N/A  . Number of Children: 1  . Years of Education: N/A   Occupational History  . Service technician     [employer]   Social History Main Topics  . Smoking status: Never Smoker   . Smokeless tobacco: Never Used  . Alcohol Use: 0.0 oz/week    0 Standard drinks or equivalent per week  . Drug Use: No  . Sexual Activity: Not on file   Other Topics Concern  . Not on file   Social History Narrative   Review of Systems Weight up 70-80# over the past 8 years or so Sleeps okay--though did have night sweats for a week or so (?related to the med)    Objective:   Physical Exam  Constitutional: He appears well-developed and well-nourished. No distress.  Psychiatric: He has a normal mood and affect. His behavior is normal.          Assessment & Plan:

## 2015-12-18 ENCOUNTER — Ambulatory Visit: Payer: 59 | Admitting: Psychology

## 2016-01-02 ENCOUNTER — Ambulatory Visit (INDEPENDENT_AMBULATORY_CARE_PROVIDER_SITE_OTHER): Payer: 59 | Admitting: Psychology

## 2016-01-02 DIAGNOSIS — F4323 Adjustment disorder with mixed anxiety and depressed mood: Secondary | ICD-10-CM

## 2016-01-14 ENCOUNTER — Ambulatory Visit: Payer: 59 | Admitting: Psychology

## 2016-02-14 ENCOUNTER — Other Ambulatory Visit: Payer: Self-pay

## 2016-02-14 MED ORDER — OMEPRAZOLE 20 MG PO CPDR: 20.0000 mg | DELAYED_RELEASE_CAPSULE | Freq: Two times a day (BID) | ORAL | Status: DC

## 2016-02-14 MED ORDER — ESCITALOPRAM OXALATE 20 MG PO TABS: 20.0000 mg | ORAL_TABLET | Freq: Every day | ORAL | Status: DC

## 2016-02-14 NOTE — Telephone Encounter (Signed)
Patient has been taking the 10mg  of Lexapro. He thinks he needs to increase the dosage as discussed at a previous OV.

## 2016-02-14 NOTE — Telephone Encounter (Signed)
Let him know I sent the higher dose for him. I need to see him in 1-2 months to reassess his response

## 2016-02-14 NOTE — Telephone Encounter (Signed)
Left a message with wife, since she is the one that asked for the refill at her daughter's visit. Asked that one of them call the office to set up a follow-up appointment

## 2016-03-05 ENCOUNTER — Encounter: Payer: 59 | Admitting: Internal Medicine

## 2016-07-03 ENCOUNTER — Other Ambulatory Visit: Payer: Self-pay

## 2016-07-03 MED ORDER — ALBUTEROL SULFATE HFA 108 (90 BASE) MCG/ACT IN AERS: 2.0000 | INHALATION_SPRAY | Freq: Two times a day (BID) | RESPIRATORY_TRACT | 0 refills | Status: DC | PRN

## 2016-07-03 NOTE — Telephone Encounter (Signed)
Approved: #1 x 0

## 2016-07-03 NOTE — Telephone Encounter (Signed)
Pt left v/m requesting refill proair inhaler; do not see where Dr Alphonsus SiasLetvak has prescribed inhaler before; pt last seen 12/12/15 and pt was to return in 01/2016 but pt cancelled appt. Pt is out of town and will have info of what pharmacy to call med to.Please advise.

## 2016-07-10 ENCOUNTER — Ambulatory Visit: Payer: Self-pay

## 2016-07-14 ENCOUNTER — Ambulatory Visit (INDEPENDENT_AMBULATORY_CARE_PROVIDER_SITE_OTHER): Payer: Commercial Managed Care - HMO

## 2016-07-14 DIAGNOSIS — Z23 Encounter for immunization: Secondary | ICD-10-CM

## 2016-08-25 ENCOUNTER — Encounter: Payer: Commercial Managed Care - HMO | Admitting: Internal Medicine

## 2016-09-09 ENCOUNTER — Encounter: Payer: Commercial Managed Care - HMO | Admitting: Internal Medicine

## 2016-09-21 ENCOUNTER — Encounter: Payer: Self-pay | Admitting: Internal Medicine

## 2016-09-21 ENCOUNTER — Ambulatory Visit (INDEPENDENT_AMBULATORY_CARE_PROVIDER_SITE_OTHER): Payer: Commercial Managed Care - HMO | Admitting: Internal Medicine

## 2016-09-21 VITALS — BP 132/78 | HR 85 | Temp 98.7°F | Ht 72.25 in | Wt 304.0 lb

## 2016-09-21 DIAGNOSIS — R1011 Right upper quadrant pain: Secondary | ICD-10-CM | POA: Diagnosis not present

## 2016-09-21 DIAGNOSIS — F411 Generalized anxiety disorder: Secondary | ICD-10-CM | POA: Diagnosis not present

## 2016-09-21 DIAGNOSIS — Z0000 Encounter for general adult medical examination without abnormal findings: Secondary | ICD-10-CM | POA: Diagnosis not present

## 2016-09-21 DIAGNOSIS — K219 Gastro-esophageal reflux disease without esophagitis: Secondary | ICD-10-CM

## 2016-09-21 DIAGNOSIS — R7989 Other specified abnormal findings of blood chemistry: Secondary | ICD-10-CM | POA: Diagnosis not present

## 2016-09-21 MED ORDER — DULOXETINE HCL 30 MG PO CPEP: 30.0000 mg | ORAL_CAPSULE | Freq: Every day | ORAL | 1 refills | Status: DC

## 2016-09-21 MED ORDER — MONTELUKAST SODIUM 10 MG PO TABS: 10.0000 mg | ORAL_TABLET | Freq: Every day | ORAL | 11 refills | Status: DC

## 2016-09-21 NOTE — Assessment & Plan Note (Signed)
Still symptomatic Will add duloxetine

## 2016-09-21 NOTE — Assessment & Plan Note (Signed)
Usually post prandial Exam benign Low risk but will check ultrasound since typical in location and timing

## 2016-09-21 NOTE — Assessment & Plan Note (Signed)
Healthy but has let himself go a bit with the stress of 2nd child Counseled DASH info given  Check labs

## 2016-09-21 NOTE — Assessment & Plan Note (Signed)
May be causing the pain issues but now in abdomen also Check ultrasound

## 2016-09-21 NOTE — Progress Notes (Signed)
Subjective:    Patient ID: Eric Norris, male    DOB: January 29, 1986, 30 y.o.   MRN: [id number]  HPI Here for physical  Still gets some of the chest pain Happens after eating a lot More in RUQ actually ---though still occasionally in left chest Can last for several minutes---like through 15-20 minute ride, till he can get out and stretch Still takes the omeprazole bid No sig heartburn if he eats spicy food No dysphagia  Thinks his anxiety is still acting up Still has sense "that something bad is going to happen" Still thinks the worst---worries that he is going to die (though all his testing has been negative)  Current Outpatient Prescriptions on File Prior to Visit  Medication Sig Dispense Refill  . albuterol (PROAIR HFA) 108 (90 Base) MCG/ACT inhaler Inhale 2 puffs into the lungs 2 (two) times daily as needed for wheezing or shortness of breath. 1 Inhaler 0  . albuterol (PROVENTIL) (2.5 MG/3ML) 0.083% nebulizer solution Take 0.625-1.25 mg by nebulization daily as needed for wheezing or shortness of breath.    . escitalopram (LEXAPRO) 20 MG tablet Take 1 tablet (20 mg total) by mouth daily. 30 tablet 11  . montelukast (SINGULAIR) 10 MG tablet Take 1 tablet (10 mg total) by mouth at bedtime. 30 tablet 11  . omeprazole (PRILOSEC) 20 MG capsule Take 1 capsule (20 mg total) by mouth 2 (two) times daily. 60 capsule 11   No current facility-administered medications on file prior to visit.     No Known Allergies  Past Medical History:  Diagnosis Date  . Asthma   . Cold-induced asthma   . GERD (gastroesophageal reflux disease)   . Migraine without aura   . Von Willebrand disease (HCC)   . Von Willebrand disease (HCC)     No past surgical history on file.  Family History  Problem Relation Age of Onset  . Ulcerative colitis Sister     Social History   Social History  . Marital status: Married    Spouse name: N/A  . Number of children: 2  . Years of education: N/A    Occupational History  . Service technician     [employer]   Social History Main Topics  . Smoking status: Never Smoker  . Smokeless tobacco: Never Used  . Alcohol use 0.0 oz/week  . Drug use: No  . Sexual activity: Not on file   Other Topics Concern  . Not on file   Social History Narrative  . No narrative on file   Review of Systems  Constitutional:       Adjusting to having a second child Has gained about 25# recently Wears seat belt  HENT: Negative for dental problem, hearing loss and tinnitus.        Overdue for dentist  Eyes: Negative for visual disturbance.       No diplopia or unilateral vision loss  Respiratory: Negative for cough, chest tightness and shortness of breath.   Cardiovascular: Positive for chest pain. Negative for palpitations and leg swelling.  Gastrointestinal: Negative for constipation, nausea and vomiting.       May have blood on toilet paper if he has to strain ---not in stool  Endocrine: Negative for polydipsia and polyuria.  Genitourinary: Negative for difficulty urinating and urgency.       No sexual problems  Musculoskeletal: Negative for arthralgias, back pain and joint swelling.  Skin: Negative for rash.  Allergic/Immunologic: Positive for environmental allergies. Negative  for immunocompromised state.  Neurological: Positive for dizziness. Negative for syncope, light-headedness and headaches.  Hematological: Negative for adenopathy. Does not bruise/bleed easily.  Psychiatric/Behavioral: The patient is nervous/anxious.        Wears compression socks for restless legs--or has to get up No true depression but trying to find life balance with 2 kids       Objective:   Physical Exam  Constitutional: He is oriented to person, place, and time. He appears well-developed and well-nourished. No distress.  HENT:  Head: Normocephalic and atraumatic.  Right Ear: External ear normal.  Left Ear: External ear normal.  Mouth/Throat: Oropharynx is  clear and moist. No oropharyngeal exudate.  Eyes: Conjunctivae are normal. Pupils are equal, round, and reactive to light.  Neck: Normal range of motion. Neck supple. No thyromegaly present.  Cardiovascular: Normal rate, regular rhythm, normal heart sounds and intact distal pulses.  Exam reveals no gallop.   No murmur heard. Pulmonary/Chest: Effort normal and breath sounds normal. No respiratory distress. He has no wheezes. He has no rales.  Abdominal: Soft. He exhibits no distension. There is no tenderness. There is no rebound and no guarding.  Musculoskeletal: He exhibits no edema or tenderness.  Lymphadenopathy:    He has no cervical adenopathy.  Neurological: He is alert and oriented to person, place, and time.  Skin: No rash noted. No erythema.  Psychiatric: He has a normal mood and affect. His behavior is normal.          Assessment & Plan:

## 2016-09-21 NOTE — Progress Notes (Signed)
Pre visit review using our clinic review tool, if applicable. No additional management support is needed unless otherwise documented below in the visit note. 

## 2016-09-22 LAB — COMPREHENSIVE METABOLIC PANEL
ALBUMIN: 4.6 g/dL (ref 3.5–5.2)
ALT: 31 U/L (ref 0–53)
AST: 20 U/L (ref 0–37)
Alkaline Phosphatase: 70 U/L (ref 39–117)
BUN: 13 mg/dL (ref 6–23)
CO2: 28 mEq/L (ref 19–32)
Calcium: 10 mg/dL (ref 8.4–10.5)
Chloride: 102 mEq/L (ref 96–112)
Creatinine, Ser: 1.02 mg/dL (ref 0.40–1.50)
GFR: 90.91 mL/min (ref >60.00)
Glucose, Bld: 84 mg/dL (ref 70–99)
POTASSIUM: 4.4 meq/L (ref 3.5–5.1)
Sodium: 140 mEq/L (ref 135–145)
TOTAL PROTEIN: 7.6 g/dL (ref 6.0–8.3)
Total Bilirubin: 0.3 mg/dL (ref 0.2–1.2)

## 2016-09-22 LAB — CBC WITH DIFFERENTIAL/PLATELET
Basophils Absolute: 0 10*3/uL (ref 0.0–0.1)
Basophils Relative: 0.5 % (ref 0.0–3.0)
EOS PCT: 2.9 % (ref 0.0–5.0)
Eosinophils Absolute: 0.3 10*3/uL (ref 0.0–0.7)
HCT: 43.2 % (ref 39.0–52.0)
HEMOGLOBIN: 14.5 g/dL (ref 13.0–17.0)
LYMPHS PCT: 32.4 % (ref 12.0–46.0)
Lymphs Abs: 3.1 10*3/uL (ref 0.7–4.0)
MCHC: 33.6 g/dL (ref 30.0–36.0)
MCV: 86.1 fl (ref 78.0–100.0)
MONOS PCT: 7.6 % (ref 3.0–12.0)
Monocytes Absolute: 0.7 10*3/uL (ref 0.1–1.0)
Neutro Abs: 5.3 10*3/uL (ref 1.4–7.7)
Neutrophils Relative %: 56.6 % (ref 43.0–77.0)
Platelets: 355 10*3/uL (ref 150.0–400.0)
RBC: 5.02 Mil/uL (ref 4.22–5.81)
RDW: 13.2 % (ref 11.5–15.5)
WBC: 9.4 10*3/uL (ref 4.0–10.5)

## 2016-09-22 LAB — LIPID PANEL
Cholesterol: 215 mg/dL — ABNORMAL HIGH (ref 0–200)
HDL: 41.3 mg/dL (ref >39.00)
NonHDL: 173.73
Total CHOL/HDL Ratio: 5
Triglycerides: 393 mg/dL — ABNORMAL HIGH (ref 0.0–149.0)
VLDL: 78.6 mg/dL — AB (ref 0.0–40.0)

## 2016-09-22 LAB — T4, FREE: Free T4: 0.72 ng/dL (ref 0.60–1.60)

## 2016-09-22 LAB — LDL CHOLESTEROL, DIRECT: LDL DIRECT: 123 mg/dL

## 2016-09-22 LAB — LIPASE: Lipase: 25 U/L (ref 11.0–59.0)

## 2016-09-28 ENCOUNTER — Ambulatory Visit
Admission: RE | Admit: 2016-09-28 | Discharge: 2016-09-28 | Disposition: A | Discharge status: Home / Self Care | Payer: Commercial Managed Care - HMO | Source: Ambulatory Visit | Attending: Internal Medicine | Admitting: Internal Medicine

## 2016-09-28 DIAGNOSIS — R932 Abnormal findings on diagnostic imaging of liver and biliary tract: Secondary | ICD-10-CM | POA: Diagnosis not present

## 2016-09-28 DIAGNOSIS — R1011 Right upper quadrant pain: Secondary | ICD-10-CM | POA: Insufficient documentation

## 2016-09-30 ENCOUNTER — Telehealth: Payer: Self-pay | Admitting: Internal Medicine

## 2016-09-30 NOTE — Telephone Encounter (Signed)
Pt called to state that his insurance co would not cover his  anxiety medication at 2 pills per day.  He is wondering if you can up the dose to take one pill a day or he could take something else.  Can you please call him back.

## 2016-10-01 MED ORDER — DULOXETINE HCL 60 MG PO CPEP: 60.0000 mg | ORAL_CAPSULE | Freq: Every day | ORAL | 1 refills | Status: DC

## 2016-10-01 NOTE — Telephone Encounter (Signed)
He was given #7 of the 30mg  but the y would not cover the 2 a day. So, he has been taking the 30mg  for the last week. I told him to just start taking 1 a day of the new 60mg  Cymbalta.

## 2016-10-01 NOTE — Telephone Encounter (Signed)
Okay to change it to duloxetine 60mg   #30 x 1 If they can be cut in half--have him cut the first 2 in half. If not, have him take it every other day for the first 4 days, then go up to daily

## 2016-10-22 ENCOUNTER — Ambulatory Visit (INDEPENDENT_AMBULATORY_CARE_PROVIDER_SITE_OTHER): Payer: Commercial Managed Care - HMO | Admitting: Internal Medicine

## 2016-10-22 ENCOUNTER — Encounter: Payer: Self-pay | Admitting: Internal Medicine

## 2016-10-22 VITALS — BP 114/82 | HR 81 | Temp 98.1°F | Resp 12 | Wt 300.0 lb

## 2016-10-22 DIAGNOSIS — J069 Acute upper respiratory infection, unspecified: Secondary | ICD-10-CM | POA: Diagnosis not present

## 2016-10-22 DIAGNOSIS — F411 Generalized anxiety disorder: Secondary | ICD-10-CM

## 2016-10-22 MED ORDER — AMOXICILLIN 500 MG PO TABS: 1000.0000 mg | ORAL_TABLET | Freq: Two times a day (BID) | ORAL | 0 refills | Status: AC

## 2016-10-22 NOTE — Patient Instructions (Signed)
Let me know if the duloxetine seems to stop working for you. Start the antibiotic if you are worsening in the next few days.

## 2016-10-22 NOTE — Progress Notes (Signed)
Pre visit review using our clinic review tool, if applicable. No additional management support is needed unless otherwise documented below in the visit note. 

## 2016-10-22 NOTE — Progress Notes (Signed)
   Subjective:    Patient ID: Eric Norris, male    DOB: 1986/06/09, 30 y.o.   MRN: [id number]  HPI Here for follow up of the anxiety treatment  He also notes cold Bad cough---green sputum for 4-5 days Using mucinex and flonase Also using airborne Some better but voice not back to normal--started with hoarseness Sputum seems to be coming from his chest Started with fever--this is gone now No SOB Cold sweats with this illness  Has done well with the duloxetine No apparent side effects--though he feels "a little different" The anxiety is some better Not reading into things too much Does note he was anhedonic--- now back to wood working. This has helped relieve his stress  Current Outpatient Prescriptions on File Prior to Visit  Medication Sig Dispense Refill  . albuterol (PROAIR HFA) 108 (90 Base) MCG/ACT inhaler Inhale 2 puffs into the lungs 2 (two) times daily as needed for wheezing or shortness of breath. 1 Inhaler 0  . albuterol (PROVENTIL) (2.5 MG/3ML) 0.083% nebulizer solution Take 0.625-1.25 mg by nebulization daily as needed for wheezing or shortness of breath.    . DULoxetine (CYMBALTA) 60 MG capsule Take 1 capsule (60 mg total) by mouth daily. 30 capsule 1  . escitalopram (LEXAPRO) 20 MG tablet Take 1 tablet (20 mg total) by mouth daily. 30 tablet 11  . montelukast (SINGULAIR) 10 MG tablet Take 1 tablet (10 mg total) by mouth at bedtime. 30 tablet 11  . omeprazole (PRILOSEC) 20 MG capsule Take 1 capsule (20 mg total) by mouth 2 (two) times daily. 60 capsule 11   No current facility-administered medications on file prior to visit.     No Known Allergies  Past Medical History:  Diagnosis Date  . Asthma   . Cold-induced asthma   . GERD (gastroesophageal reflux disease)   . Migraine without aura   . Von Willebrand disease (HCC)   . Von Willebrand disease (HCC)     No past surgical history on file.  Family History  Problem Relation Age of Onset  . Ulcerative  colitis Sister     Social History   Social History  . Marital status: Married    Spouse name: N/A  . Number of children: 2  . Years of education: N/A   Occupational History  . Service technician     [employer]   Social History Main Topics  . Smoking status: Never Smoker  . Smokeless tobacco: Never Used  . Alcohol use 0.0 oz/week  . Drug use: No  . Sexual activity: Not on file   Other Topics Concern  . Not on file   Social History Narrative  . No narrative on file   Review of Systems  No rash Notices some pain around nipples--over the past couple of weeks No vomiting or diarrhea     Objective:   Physical Exam  Constitutional: He appears well-nourished. No distress.  HENT:  Slight left frontal tenderness Marked nasal inflammation TMs normal Slight pharyngeal injection without exudates  Neck: Neck supple.  Pulmonary/Chest: Effort normal and breath sounds normal. No respiratory distress. He has no wheezes. He has no rales.  Lymphadenopathy:    He has no cervical adenopathy.  Skin: No rash noted. No pallor.  Psychiatric: He has a normal mood and affect. His behavior is normal.          Assessment & Plan:

## 2016-10-22 NOTE — Assessment & Plan Note (Signed)
Has had a response to the duloxetine No sig side effects Not sure whether he is at new baseline Discussed tachyphlaxis-- if effect wanes, will increase to 90mg 

## 2016-10-22 NOTE — Assessment & Plan Note (Signed)
Seems to be worsening with lots of purulent sputum Discussed stopping the mucinex Discussed supportive care If worsening, he will fill the amoxicillin

## 2017-01-13 ENCOUNTER — Other Ambulatory Visit: Payer: Self-pay | Admitting: Internal Medicine

## 2017-01-21 ENCOUNTER — Ambulatory Visit: Payer: Commercial Managed Care - HMO | Admitting: Internal Medicine

## 2017-02-21 ENCOUNTER — Other Ambulatory Visit: Payer: Self-pay | Admitting: Internal Medicine

## 2017-03-24 ENCOUNTER — Other Ambulatory Visit: Payer: Self-pay | Admitting: Internal Medicine

## 2017-08-12 ENCOUNTER — Ambulatory Visit (INDEPENDENT_AMBULATORY_CARE_PROVIDER_SITE_OTHER): Payer: Commercial Managed Care - HMO

## 2017-08-12 DIAGNOSIS — Z23 Encounter for immunization: Secondary | ICD-10-CM | POA: Diagnosis not present

## 2017-08-24 ENCOUNTER — Other Ambulatory Visit: Payer: Self-pay | Admitting: Internal Medicine

## 2017-09-23 ENCOUNTER — Other Ambulatory Visit: Payer: Self-pay | Admitting: Internal Medicine

## 2017-10-22 ENCOUNTER — Other Ambulatory Visit: Payer: Self-pay | Admitting: Internal Medicine

## 2017-10-29 IMAGING — US US ABDOMEN LIMITED
1 series · 14 of 25 positions shown · non-contrast
Comparison: None in PACs

CLINICAL DATA: Intermittent right upper quadrant pain
postprandially for the past month.

EXAM:
US ABDOMEN LIMITED - RIGHT UPPER QUADRANT

[Series 1: us abdomen limited · 0.22mm/px · 14 of 57 slices shown]
[im 1/57]
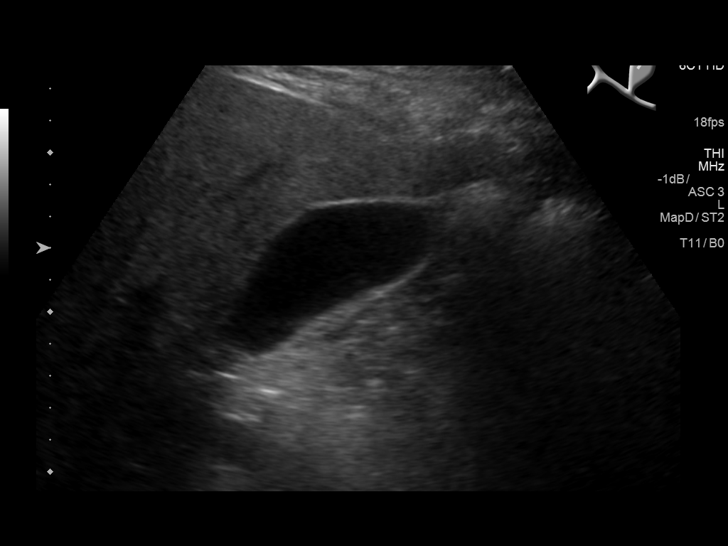
[im 5/57]
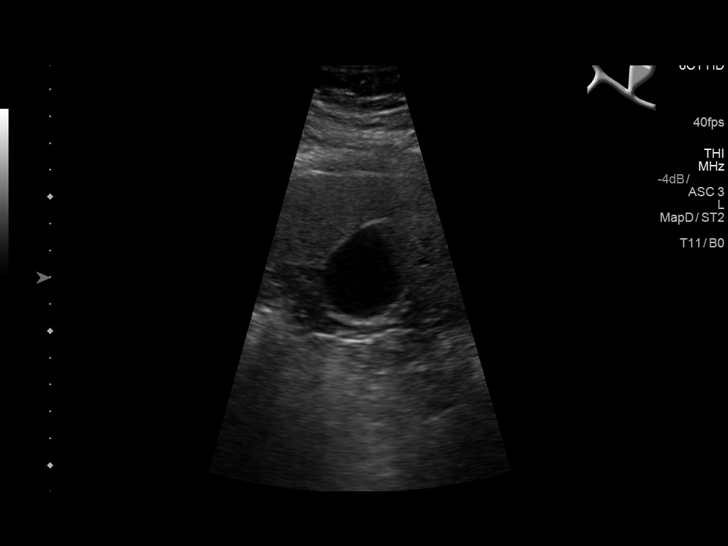
[im 10/57]
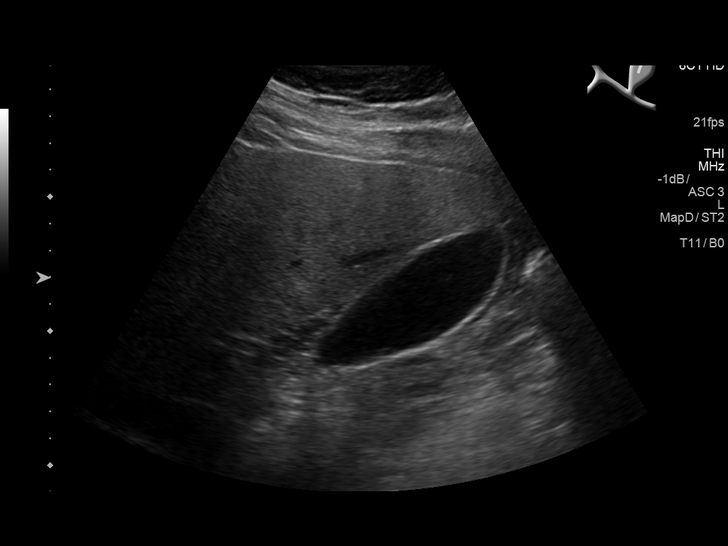
[im 15/57]
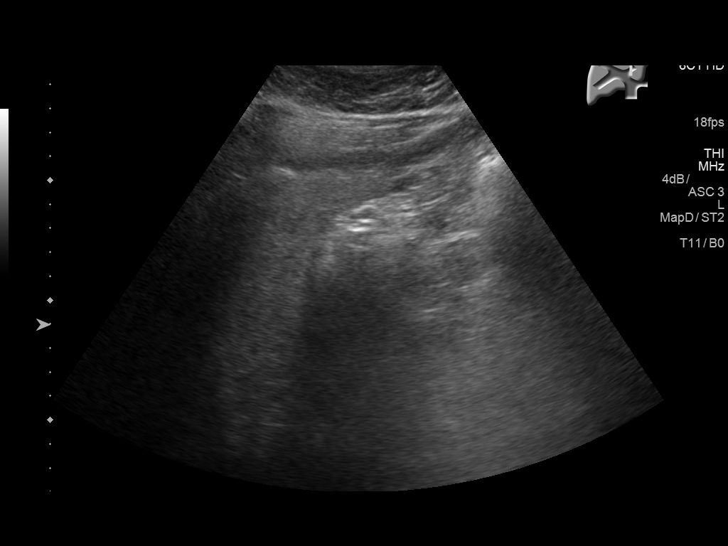
[im 19/57]
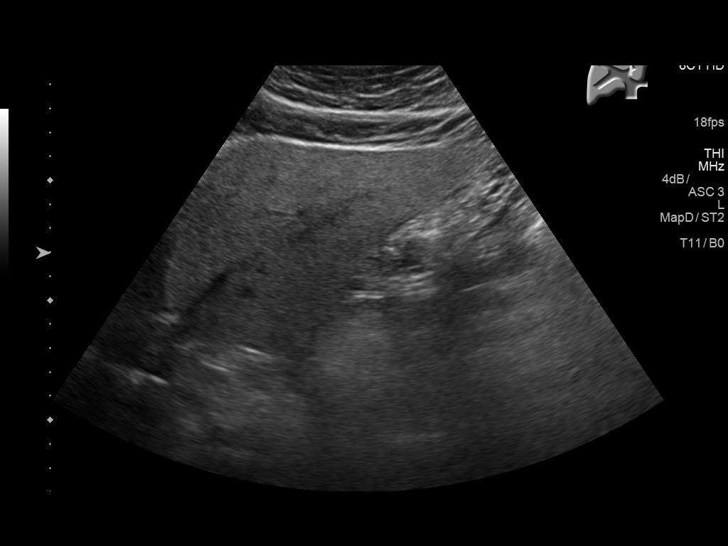
[im 22/57]
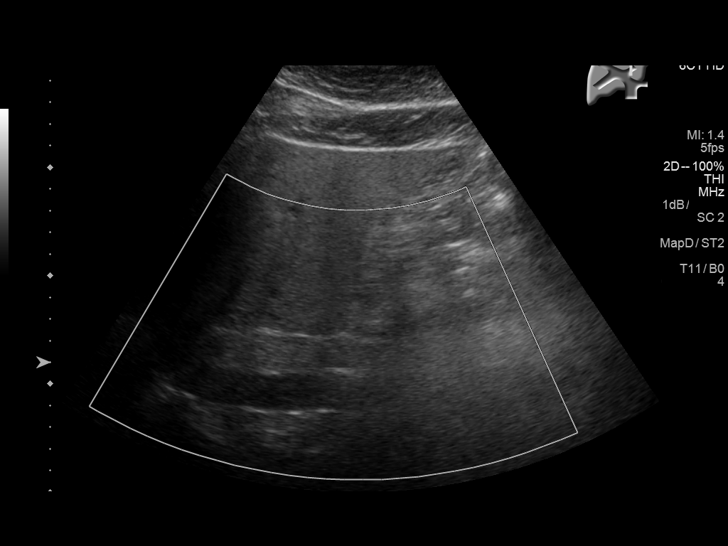
[im 26/57]
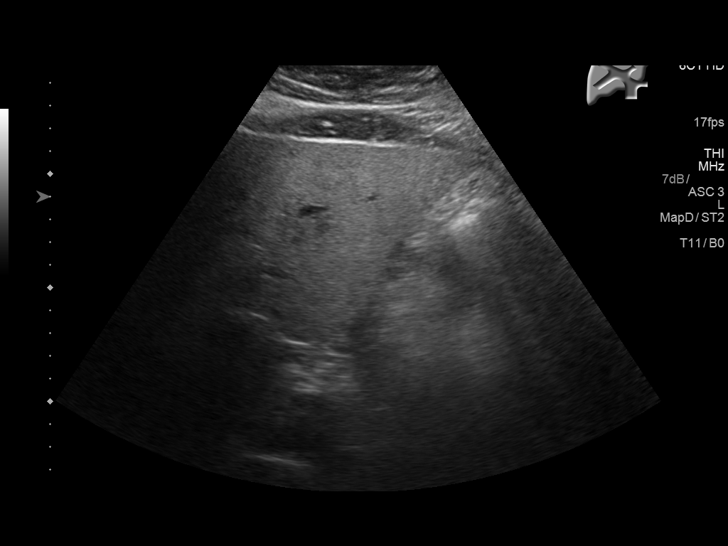
[im 31/57]
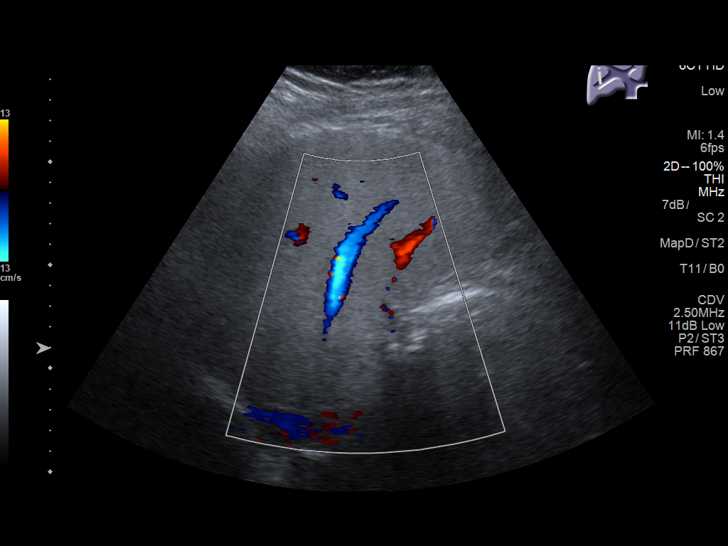
[im 36/57]
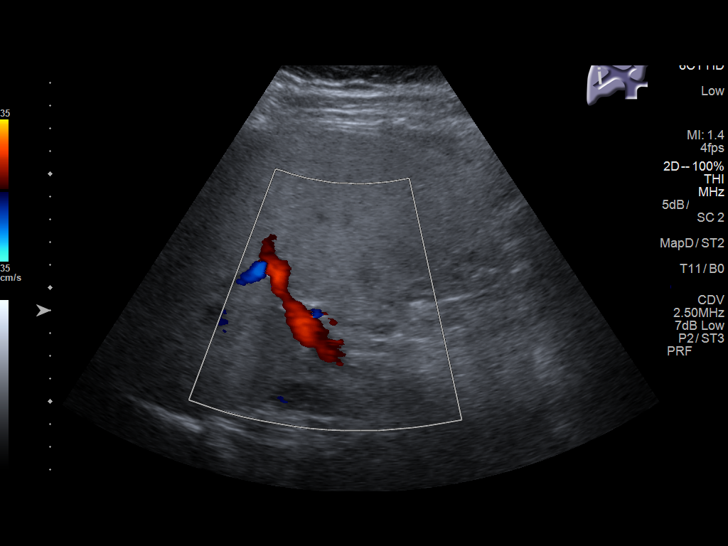
[im 38/57]
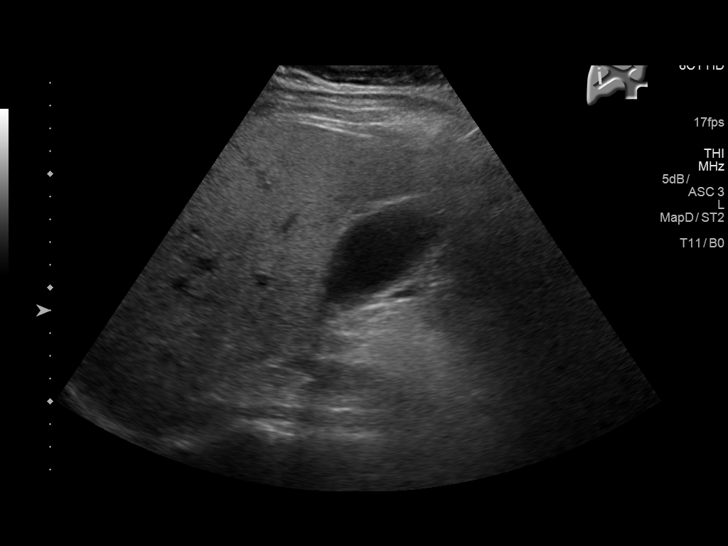
[im 43/57]
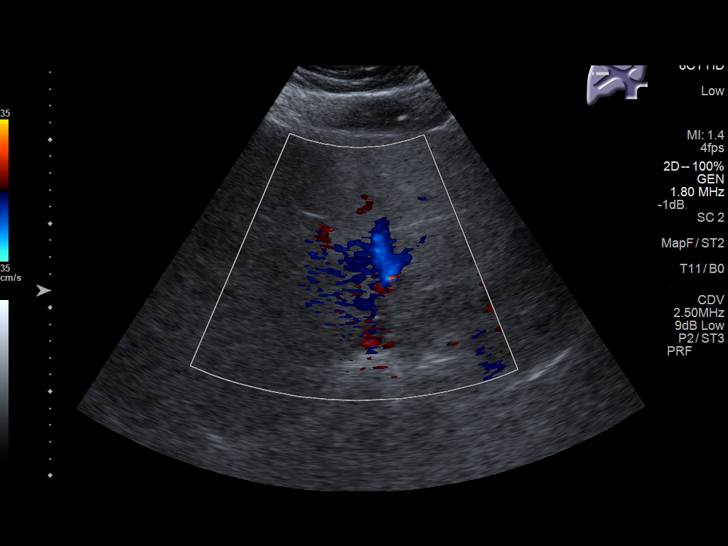
[im 47/57]
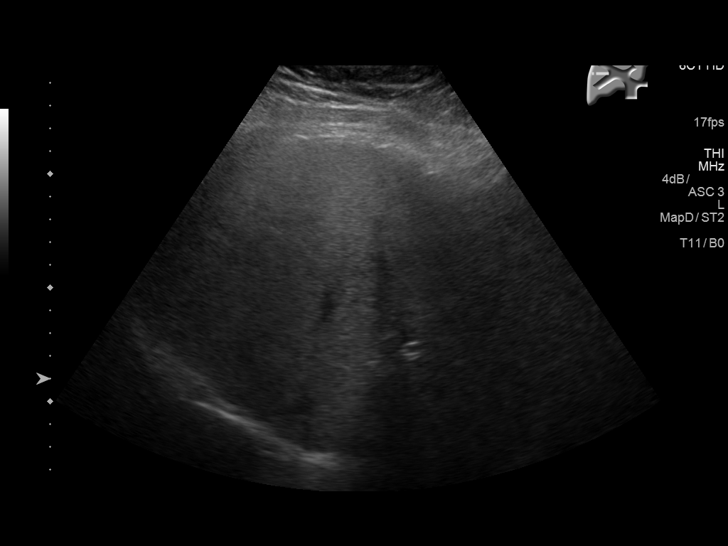
[im 52/57]
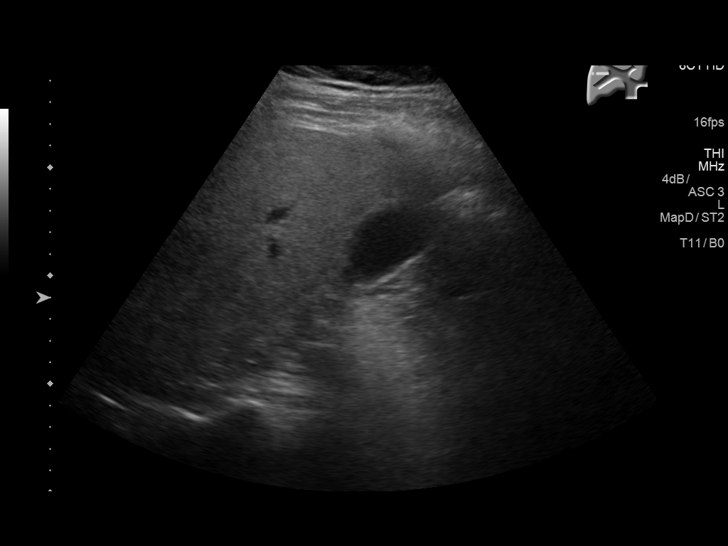
[im 57/57]
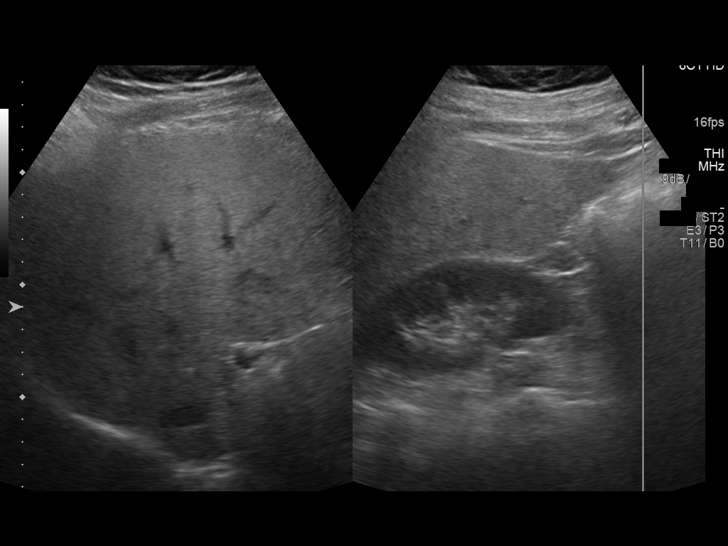

[14 of 25 positions shown; findings below may reference images not displayed]

FINDINGS: Gallbladder:

No gallstones or wall thickening visualized. No sonographic Murphy
sign noted by sonographer.

Common bile duct:

Diameter: 5 mm

Liver:

The hepatic echotexture is increased diffusely. There is no discrete
mass or ductal dilation. The surface contour of the liver is smooth.
IMPRESSION: No gallstones or sonographic evidence of acute cholecystitis. If
there are clinical concerns of chronic cholecystitis, a nuclear
medicine hepatobiliary scan with gallbladder ejection fraction
determination may be useful.

Increased hepatic echotexture compatible with fatty infiltrative
change.

## 2017-11-29 ENCOUNTER — Ambulatory Visit (INDEPENDENT_AMBULATORY_CARE_PROVIDER_SITE_OTHER): Payer: 59 | Admitting: Internal Medicine

## 2017-11-29 ENCOUNTER — Encounter: Payer: Self-pay | Admitting: Internal Medicine

## 2017-11-29 VITALS — BP 120/88 | HR 92 | Temp 98.9°F | Wt 267.0 lb

## 2017-11-29 DIAGNOSIS — F321 Major depressive disorder, single episode, moderate: Secondary | ICD-10-CM

## 2017-11-29 DIAGNOSIS — F3340 Major depressive disorder, recurrent, in remission, unspecified: Secondary | ICD-10-CM | POA: Insufficient documentation

## 2017-11-29 DIAGNOSIS — F331 Major depressive disorder, recurrent, moderate: Secondary | ICD-10-CM | POA: Insufficient documentation

## 2017-11-29 MED ORDER — ESCITALOPRAM OXALATE 20 MG PO TABS: 20.0000 mg | ORAL_TABLET | Freq: Every day | ORAL | 11 refills | Status: DC

## 2017-11-29 MED ORDER — DULOXETINE HCL 60 MG PO CPEP: 60.0000 mg | ORAL_CAPSULE | Freq: Every day | ORAL | 11 refills | Status: DC

## 2017-11-29 NOTE — Assessment & Plan Note (Signed)
Complicating chronic anxiety Some childhood issues that bother him--but not sure counseling will help He promised (contract) that he would not try suicide (and is not currently considering this) No access to weapons Will restart his meds Discussed initial increased SI---- to ER Will recheck next week Discuss counseling again 30 minute visit--- about 15 in counseling and creating plans
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
## 2017-12-13 ENCOUNTER — Ambulatory Visit (INDEPENDENT_AMBULATORY_CARE_PROVIDER_SITE_OTHER): Payer: 59 | Admitting: Internal Medicine

## 2017-12-13 ENCOUNTER — Encounter: Payer: Self-pay | Admitting: Internal Medicine

## 2017-12-13 VITALS — BP 112/78 | HR 70 | Temp 98.3°F | Wt 267.0 lb

## 2017-12-13 DIAGNOSIS — F321 Major depressive disorder, single episode, moderate: Secondary | ICD-10-CM

## 2017-12-13 NOTE — Assessment & Plan Note (Addendum)
Markedly better back on his meds Discussed that he should not go off his meds (given the severity of this spell off his meds despite not having this type of depression before--mostly anxiety) Discussed he should call or email if any recurrence (may need to adjust dose)

## 2017-12-13 NOTE — Progress Notes (Signed)
   Subjective:    Patient ID: Eric Norris, male    DOB: 07-19-1986, 32 y.o.   MRN: [id number]  HPI Here for follow up of depression  He restarted the meds Hasn't been crying at all No negative thoughts--no thoughts about dying, etc Still with stress, etc  No apparent side effects with the meds Not missing any work  Current Outpatient Medications on File Prior to Visit  Medication Sig Dispense Refill  . albuterol (PROAIR HFA) 108 (90 Base) MCG/ACT inhaler Inhale 2 puffs into the lungs 2 (two) times daily as needed for wheezing or shortness of breath. 1 Inhaler 0  . albuterol (PROVENTIL) (2.5 MG/3ML) 0.083% nebulizer solution TAKE 3 MILLILITERS (2.5 MG TOTAL) BY NEBULIZATION EVERY 6 (SIX) HOURS AS NEEDED. 75 mL 0  . DULoxetine (CYMBALTA) 60 MG capsule Take 1 capsule (60 mg total) by mouth daily. 30 capsule 11  . escitalopram (LEXAPRO) 20 MG tablet Take 1 tablet (20 mg total) by mouth daily. 30 tablet 11  . montelukast (SINGULAIR) 10 MG tablet TAKE 1 TABLET (10 MG TOTAL) BY MOUTH AT BEDTIME. 30 tablet 2  . omeprazole (PRILOSEC) 20 MG capsule TAKE 1 CAPSULE (20 MG TOTAL) BY MOUTH 2 (TWO) TIMES DAILY. 60 capsule 1   No current facility-administered medications on file prior to visit.     No Known Allergies  Past Medical History:  Diagnosis Date  . Asthma   . Cold-induced asthma   . GERD (gastroesophageal reflux disease)   . Migraine without aura   . Von Willebrand disease (HCC)   . Von Willebrand disease (HCC)     History reviewed. No pertinent surgical history.  Family History  Problem Relation Age of Onset  . Ulcerative colitis Sister     Social History   Socioeconomic History  . Marital status: Married    Spouse name: Not on file  . Number of children: 2  . Years of education: Not on file  . Highest education level: Not on file  Social Needs  . Financial resource strain: Not on file  . Food insecurity - worry: Not on file  . Food insecurity - inability: Not  on file  . Transportation needs - medical: Not on file  . Transportation needs - non-medical: Not on file  Occupational History  . 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 Smoking status: Never Smoker  . Smokeless tobacco: Never Used  Substance and Sexual Activity  . Alcohol use: Yes    Alcohol/week: 0.0 oz  . Drug use: No  . Sexual activity: Not on file  Other Topics Concern  . Not on file  Social History Narrative  . Not on file   Review of Systems Sleep still not great--- some RLS (wears compression socks). Was up due to this a few nights Appetite is better Weight now stable    Objective:   Physical Exam  Constitutional: He appears well-developed. No distress.  Psychiatric:  Normal speech and appearance No depression Affect appropriate          Assessment & Plan:

## 2017-12-14 ENCOUNTER — Other Ambulatory Visit: Payer: Self-pay

## 2017-12-14 MED ORDER — ALBUTEROL SULFATE HFA 108 (90 BASE) MCG/ACT IN AERS: 2.0000 | INHALATION_SPRAY | Freq: Two times a day (BID) | RESPIRATORY_TRACT | 1 refills | Status: DC | PRN

## 2017-12-14 NOTE — Telephone Encounter (Signed)
Rx sent electronically.  

## 2018-01-02 ENCOUNTER — Other Ambulatory Visit: Payer: Self-pay | Admitting: Internal Medicine

## 2018-01-10 ENCOUNTER — Ambulatory Visit: Payer: 59 | Admitting: Internal Medicine

## 2018-05-13 ENCOUNTER — Other Ambulatory Visit: Payer: Self-pay | Admitting: Internal Medicine

## 2018-06-07 ENCOUNTER — Ambulatory Visit: Payer: Self-pay | Admitting: Internal Medicine

## 2018-06-14 ENCOUNTER — Ambulatory Visit (INDEPENDENT_AMBULATORY_CARE_PROVIDER_SITE_OTHER): Payer: Managed Care, Other (non HMO) | Admitting: Internal Medicine

## 2018-06-14 ENCOUNTER — Other Ambulatory Visit: Payer: Self-pay | Admitting: Internal Medicine

## 2018-06-14 ENCOUNTER — Encounter: Payer: Self-pay | Admitting: Internal Medicine

## 2018-06-14 VITALS — BP 100/74 | HR 98 | Temp 98.3°F | Ht 73.0 in | Wt 261.0 lb

## 2018-06-14 DIAGNOSIS — J4531 Mild persistent asthma with (acute) exacerbation: Secondary | ICD-10-CM

## 2018-06-14 DIAGNOSIS — J4530 Mild persistent asthma, uncomplicated: Secondary | ICD-10-CM | POA: Insufficient documentation

## 2018-06-14 MED ORDER — FLUTICASONE PROPIONATE HFA 110 MCG/ACT IN AERO: 2.0000 | INHALATION_SPRAY | Freq: Two times a day (BID) | RESPIRATORY_TRACT | 12 refills | Status: DC

## 2018-06-14 MED ORDER — PREDNISONE 20 MG PO TABS: 40.0000 mg | ORAL_TABLET | Freq: Every day | ORAL | 0 refills | Status: DC

## 2018-06-14 MED ORDER — MONTELUKAST SODIUM 10 MG PO TABS: 10.0000 mg | ORAL_TABLET | Freq: Every day | ORAL | 3 refills | Status: DC

## 2018-06-14 NOTE — Telephone Encounter (Signed)
Copied from CRM [id number]. Topic: Quick Communication - Rx Refill/Question >> Jun 14, 2018  4:14 PM Angela NevinWilliams, Candice N wrote: Medication: predniSONE (DELTASONE) 20 MG tablet AND fluticasone (FLOVENT HFA) 110 MCG/ACT inhaler  Pt called and stated that the pharmacy has not received either of these prescriptions. Pt is requesting these be sent again to the pharmacy below. Pt was upset and stated that he needs these ASAP.   Has the patient contacted their pharmacy? Yes, pharmacy states that they never received this request and to call the office and request resend.   Preferred Pharmacy (with phone number or street name): [id number]- [place], [place] - [address] [phone] (Phone) [phone] (Fax)

## 2018-06-14 NOTE — Progress Notes (Signed)
Subjective:    Patient ID: Eric Norris, male    DOB: [birth year], 32 y.o.   MRN: [id number]  HPI Here due to asthma problems Having trouble with breathing---sometimes albuterol doesn't even help Usually wouldn't need the albuterol other than winter Trouble over the past 3-4 months  Past controller inhaler but hasn't needed in some time No fever Cough with some mucus--green at times  Current Outpatient Medications on File Prior to Visit  Medication Sig Dispense Refill  . albuterol (PROVENTIL HFA;VENTOLIN HFA) 108 (90 Base) MCG/ACT inhaler INHALE 2 PUFFS INTO THE LUNGS 2 (TWO) TIMES DAILY AS NEEDED FOR WHEEZING OR SHORTNESS OF BREATH. 8.5 Inhaler 1  . albuterol (PROVENTIL) (2.5 MG/3ML) 0.083% nebulizer solution TAKE 3 MILLILITERS (2.5 MG TOTAL) BY NEBULIZATION EVERY 6 (SIX) HOURS AS NEEDED. 75 mL 0  . DULoxetine (CYMBALTA) 60 MG capsule Take 1 capsule (60 mg total) by mouth daily. 30 capsule 11  . escitalopram (LEXAPRO) 20 MG tablet Take 1 tablet (20 mg total) by mouth daily. 30 tablet 11  . omeprazole (PRILOSEC) 20 MG capsule TAKE 1 CAPSULE (20 MG TOTAL) BY MOUTH 2 (TWO) TIMES DAILY. 60 capsule 11   No current facility-administered medications on file prior to visit.     No Known Allergies  Past Medical History:  Diagnosis Date  . Asthma   . Cold-induced asthma   . GERD (gastroesophageal reflux disease)   . Migraine without aura   . Von Willebrand disease (HCC)   . Von Willebrand disease (HCC)     History reviewed. No pertinent surgical history.  Family History  Problem Relation Age of Onset  . Ulcerative colitis Sister     Social History   Socioeconomic History  . Marital status: Married    Spouse name: Not on file  . Number of children: 2  . Years of education: Not on file  . Highest education level: Not on file  Occupational History  . 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 Financial  resource strain: Not on file  . Food insecurity:    Worry: Not on file    Inability: Not on file  . Transportation needs:    Medical: Not on file    Non-medical: Not on file  Tobacco Use  . Smoking status: Never Smoker  . Smokeless tobacco: Never Used  Substance and Sexual Activity  . Alcohol use: Yes    Alcohol/week: 0.0 standard drinks  . Drug use: No  . Sexual activity: Not on file  Lifestyle  . Physical activity:    Days per week: Not on file    Minutes per session: Not on file  . Stress: Not on file  Relationships  . Social connections:    Talks on phone: Not on file    Gets together: Not on file    Attends religious service: Not on file    Active member of club or organization: Not on file    Attends meetings of clubs or organizations: Not on file    Relationship status: Not on file  . Intimate partner violence:    Fear of current or ex partner: Not on file    Emotionally abused: Not on file    Physically abused: Not on file    Forced sexual activity: Not on file  Other Topics Concern  . Not on file  Social History Narrative  . Not on file   Review of Systems Depression  still controlled--on meds Past montelukast--not recently Usually sleeps okay--but some night awakening with dyspnea Does wear respiratory at work---refinishing counters, etc (so has exposure)    Objective:   Physical Exam  Constitutional: He appears well-developed. No distress.  Cardiovascular: Normal rate, regular rhythm and normal heart sounds. Exam reveals no gallop.  No murmur heard. Respiratory: Effort normal. No respiratory distress. He has no rales.  Decreased breath sounds  Mild expiratory wheeze and prolonged expiration           Assessment & Plan:

## 2018-06-14 NOTE — Telephone Encounter (Signed)
[facility] [place] [facility] [place] and [facility] said pts wife has picked up flovent, singulair and prednisone.I spoke with pt to make sure pt had what he needed and  pt said his wife did pick up the singulair and flovent but did not get prednisone. [facility] to see what happened with the [facility]one. Pt will cb if needed.

## 2018-06-14 NOTE — Assessment & Plan Note (Addendum)
Unusual for him to have problems this time of year ??occupational exposure vs allergic (with high mold) Spirometry---tough to analyze due to coughing but reduced FVC and FEV1 Will give prednisone burst Start steroid inhaler Try montelukast as well

## 2018-06-14 NOTE — Patient Instructions (Signed)
Let me know if you are not much better within 1 week

## 2018-07-07 ENCOUNTER — Other Ambulatory Visit: Payer: Self-pay | Admitting: Internal Medicine

## 2018-07-28 ENCOUNTER — Ambulatory Visit (INDEPENDENT_AMBULATORY_CARE_PROVIDER_SITE_OTHER): Payer: Managed Care, Other (non HMO)

## 2018-07-28 DIAGNOSIS — Z23 Encounter for immunization: Secondary | ICD-10-CM

## 2018-08-12 ENCOUNTER — Other Ambulatory Visit: Payer: Self-pay | Admitting: Internal Medicine

## 2018-09-15 ENCOUNTER — Other Ambulatory Visit: Payer: Self-pay | Admitting: Internal Medicine

## 2018-09-19 ENCOUNTER — Encounter: Payer: Self-pay | Admitting: Internal Medicine

## 2018-09-19 DIAGNOSIS — Z0289 Encounter for other administrative examinations: Secondary | ICD-10-CM

## 2018-09-20 ENCOUNTER — Ambulatory Visit (INDEPENDENT_AMBULATORY_CARE_PROVIDER_SITE_OTHER): Payer: Managed Care, Other (non HMO) | Admitting: Family Medicine

## 2018-09-20 VITALS — BP 112/72 | HR 104 | Temp 98.6°F | Resp 16 | Ht 73.0 in | Wt 277.5 lb

## 2018-09-20 DIAGNOSIS — J4541 Moderate persistent asthma with (acute) exacerbation: Secondary | ICD-10-CM | POA: Diagnosis not present

## 2018-09-20 DIAGNOSIS — J029 Acute pharyngitis, unspecified: Secondary | ICD-10-CM

## 2018-09-20 DIAGNOSIS — J069 Acute upper respiratory infection, unspecified: Secondary | ICD-10-CM | POA: Diagnosis not present

## 2018-09-20 LAB — POCT RAPID STREP A (OFFICE): Rapid Strep A Screen: NEGATIVE

## 2018-09-20 MED ORDER — PREDNISONE 50 MG PO TABS: 50.0000 mg | ORAL_TABLET | Freq: Every day | ORAL | 0 refills | Status: AC

## 2018-09-20 NOTE — Assessment & Plan Note (Addendum)
Symptomatic treatment and asthma treatment as below.

## 2018-09-20 NOTE — Assessment & Plan Note (Addendum)
Unclear based on hx but concerning exam. At baseline asthma seems poorly controled with need for albuterol 2-3 times daily. Recommended regular Albuterol 5-6 times per day and then prednisone if not improving in next 1-2 days

## 2018-09-20 NOTE — Patient Instructions (Addendum)
Based on your symptoms, it looks like you have a virus.   Antibiotics are not need for a viral infection but the following will help:   1. Drink plenty of fluids 2. Get lots of rest  Sinus Congestion 1) Neti Pot (Saline rinse) -- 2 times day -- if tolerated 2) Flonase (Store Brand ok) - once daily 3) Over the counter congestion medications  Cough 1) Cough drops can be helpful 2) Nyquil (or nighttime cough medication) 3) Honey is proven to be one of the best cough medications   Sore Throat 1) Honey as above, cough drops 2) Ibuprofen or Aleve can be helpful 3) Salt water Gargles  If you develop fevers (Temperature >100.4), chills, worsening symptoms or symptoms lasting longer than 10 days return to clinic.     Asthma management 1) Use albuterol every 4 hours while awake for the next 24-48 hours 2) If not improving --> start Prednisone (steroid) in the morning for 5 days

## 2018-09-20 NOTE — Progress Notes (Signed)
Subjective:     Newman NipJames M Burningham is a 32 y.o. male presenting for Sore Throat (x 1 week. Hoarse, voice comes and goes. Has been hard to swallow liquids and solids. Cough is present-green phlegm coming up with some white phlegm. Runny nose and some post nasal drip. He did have a fever of 102 on 09/14/18. Has been taking OTC cough syrup, cough drops, goodies some too and Mucinex. )     Sore Throat   This is a new problem. The current episode started in the past 7 days. The problem has been gradually improving. The maximum temperature recorded prior to his arrival was 102 - 102.9 F. The fever has been present for less than 1 day. The pain is at a severity of 2/10. The pain is moderate. Associated symptoms include congestion, coughing (green and white phlegm), a hoarse voice, a plugged ear sensation, shortness of breath (has bad asthma) and trouble swallowing. Pertinent negatives include no abdominal pain, diarrhea, ear pain, headaches, neck pain, swollen glands or vomiting. He has had no exposure to strep or mono. He has tried NSAIDs (cough syrup, mucinex) for the symptoms. The treatment provided mild relief.   Difficulty speaking Cough + sore throat started together Kids with bad cough over the last couple of weeks, but no trips to the doctor Using rescue inhaler a couple of times per day 2 puffs every 4  Using albuterol 2-3 times per day prior to illness  Has not noticed a big difference in SOB Testing in Aug was after being off inhaler for a while - PEF ~500 Sleeping though the night pretty  Review of Systems  HENT: Positive for congestion, hoarse voice and trouble swallowing. Negative for ear pain.   Respiratory: Positive for cough (green and white phlegm) and shortness of breath (has bad asthma).   Gastrointestinal: Negative for abdominal pain, diarrhea and vomiting.  Musculoskeletal: Negative for neck pain.  Neurological: Negative for headaches.     Social History   Tobacco Use    Smoking Status Never Smoker  Smokeless Tobacco Never Used        Objective:    BP Readings from Last 3 Encounters:  09/20/18 112/72  06/14/18 100/74  12/13/17 112/78   Wt Readings from Last 3 Encounters:  09/20/18 277 lb 8 oz (125.9 kg)  06/14/18 261 lb (118.4 kg)  12/13/17 267 lb (121.1 kg)    BP 112/72   Pulse (!) 104   Temp 98.6 F (37 C)   Resp 16   Ht 6\' 1"  (1.854 m)   Wt 277 lb 8 oz (125.9 kg)   SpO2 96%   PF 530 L/min   BMI 36.61 kg/m    Physical Exam  Constitutional: He appears well-developed and well-nourished. He does not appear ill. No distress.  HENT:  Head: Normocephalic and atraumatic.  Right Ear: Tympanic membrane and ear canal normal.  Left Ear: Tympanic membrane and ear canal normal.  Nose: Mucosal edema and rhinorrhea present. Right sinus exhibits no maxillary sinus tenderness and no frontal sinus tenderness. Left sinus exhibits no maxillary sinus tenderness and no frontal sinus tenderness.  Mouth/Throat: Uvula is midline and mucous membranes are normal. Posterior oropharyngeal erythema present. No oropharyngeal exudate or posterior oropharyngeal edema. Tonsils are 2+ on the right. Tonsils are 2+ on the left. No tonsillar exudate.  Eyes: EOM are normal.  Neck: Neck supple.  Cardiovascular: Normal rate and regular rhythm.  No murmur heard. Pulmonary/Chest: Effort normal. No respiratory distress. He  has no wheezes.  No wheezes, but overall poor airmovement  Lymphadenopathy:    He has no cervical adenopathy.  Neurological: He is alert.  Skin: Skin is warm and dry. Capillary refill takes less than 2 seconds.  Psychiatric: He has a normal mood and affect.          Assessment & Plan:   Problem List Items Addressed This Visit      Respiratory   Acute upper respiratory infection - Primary    Symptomatic treatment and asthma treatment as below.      Asthma exacerbation    Unclear based on hx but concerning exam. At baseline asthma seems  poorly controled with need for albuterol 2-3 times daily. Recommended regular Albuterol 5-6 times per day and then prednisone if not improving in next 1-2 days      Relevant Medications   predniSONE (DELTASONE) 50 MG tablet    Other Visit Diagnoses    Sore throat       Relevant Orders   POCT rapid strep A (Completed)       Return in about 3 weeks (around 10/11/2018) for Asthma management/action plan.  Lynnda Child, MD

## 2018-09-21 ENCOUNTER — Telehealth: Payer: Self-pay | Admitting: Internal Medicine

## 2018-09-21 NOTE — Telephone Encounter (Signed)
I left a detailed message on patient's voice mail to call back and schedule 3 week asthma f/u with Dr.Letvak.

## 2018-10-12 ENCOUNTER — Other Ambulatory Visit: Payer: Self-pay | Admitting: Internal Medicine

## 2018-10-14 ENCOUNTER — Emergency Department
Admission: EM | Admit: 2018-10-14 | Discharge: 2018-10-14 | Disposition: A | Discharge status: Home / Self Care | Payer: Managed Care, Other (non HMO) | Attending: Emergency Medicine | Admitting: Emergency Medicine

## 2018-10-14 ENCOUNTER — Emergency Department: Payer: Managed Care, Other (non HMO)

## 2018-10-14 DIAGNOSIS — Z79899 Other long term (current) drug therapy: Secondary | ICD-10-CM | POA: Insufficient documentation

## 2018-10-14 DIAGNOSIS — J4541 Moderate persistent asthma with (acute) exacerbation: Secondary | ICD-10-CM | POA: Diagnosis not present

## 2018-10-14 DIAGNOSIS — R0602 Shortness of breath: Secondary | ICD-10-CM | POA: Diagnosis present

## 2018-10-14 MED ORDER — PREDNISONE 50 MG PO TABS: 50.0000 mg | ORAL_TABLET | Freq: Every day | ORAL | 0 refills | Status: AC

## 2018-10-14 MED ORDER — METHYLPREDNISOLONE SODIUM SUCC 125 MG IJ SOLR
125.0000 mg | Freq: Once | INTRAMUSCULAR | Status: AC
  Administered 2018-10-14: 125 mg via INTRAVENOUS
  Filled 2018-10-14: qty 2

## 2018-10-14 MED ORDER — IPRATROPIUM-ALBUTEROL 0.5-2.5 (3) MG/3ML IN SOLN
3.0000 mL | Freq: Once | RESPIRATORY_TRACT | Status: AC
  Administered 2018-10-14: 3 mL via RESPIRATORY_TRACT
  Filled 2018-10-14: qty 3

## 2018-10-14 MED ORDER — SODIUM CHLORIDE 0.9 % IV BOLUS
1000.0000 mL | Freq: Once | INTRAVENOUS | Status: AC
  Administered 2018-10-14: 1000 mL via INTRAVENOUS

## 2018-10-14 MED ORDER — KETOROLAC TROMETHAMINE 30 MG/ML IJ SOLN
15.0000 mg | Freq: Once | INTRAMUSCULAR | Status: AC
  Administered 2018-10-14: 15 mg via INTRAVENOUS
  Filled 2018-10-14: qty 1

## 2018-10-14 MED ORDER — ACETAMINOPHEN 500 MG PO TABS
1000.0000 mg | ORAL_TABLET | Freq: Once | ORAL | Status: AC
  Administered 2018-10-14: 1000 mg via ORAL
  Filled 2018-10-14: qty 2

## 2018-10-14 MED ORDER — MAGNESIUM SULFATE 2 GM/50ML IV SOLN
2.0000 g | Freq: Once | INTRAVENOUS | Status: AC
  Administered 2018-10-14: 2 g via INTRAVENOUS
  Filled 2018-10-14: qty 50

## 2018-10-14 NOTE — Discharge Instructions (Addendum)
It was a pleasure to take care of you today, and thank you for coming to our emergency department.  If you have any questions or concerns before leaving please ask the nurse to grab me and I'm more than happy to go through your aftercare instructions again.  If you have any concerns once you are home that you are not improving or are in fact getting worse before you can make it to your follow-up appointment, please do not hesitate to call 911 and come back for further evaluation.  Merrily BrittleNeil Carmel Waddington, MD   Dg Chest 2 View  Result Date: 10/14/2018 CLINICAL DATA:  Shortness of breath EXAM: CHEST - 2 VIEW COMPARISON:  None. FINDINGS: The heart size and mediastinal contours are within normal limits. Both lungs are clear. The visualized skeletal structures are unremarkable. IMPRESSION: No active cardiopulmonary disease. Electronically Signed   By: Deatra RobinsonKevin  Herman M.D.   On: 10/14/2018 02:15

## 2018-10-14 NOTE — ED Notes (Signed)
Introduced to patient, initial assessment completed, placed on cardiac, Spo2, and NIBP monitoring. VSS at this time. IV initiated. Call bell within reach. Awaits provider for evaluation at this time.

## 2018-10-14 NOTE — ED Notes (Signed)
Pt verbalized understanding of d/c instructions, Rx, and f/u care. No further questions at this time.Pt ambulatory to the exit with steady gait.

## 2018-10-14 NOTE — ED Provider Notes (Signed)
[facility] Emergency Department Provider Note  ____________________________________________   First MD Initiated Contact with Patient 10/14/18 0158     (approximate)  I have reviewed the triage vital signs and the nursing notes.   HISTORY  Chief Complaint Shortness of Breath   HPI Eric Norris is a 32 y.o. male to the emergency department with shortness of breath beginning earlier today.  Symptoms were gradual onset slowly progressive are now moderate severity.  He does have a past medical history of asthma although has never been hospitalized nor intubated for the asthma.  He used multiple nebulizer treatments at home with no improvement which prompted the visit.  He has been battling an upper respiratory tract infection recently.  He has had no fevers or chills.  His symptoms are worse with exertion and improved with rest.    Past Medical History:  Diagnosis Date  . Asthma   . Cold-induced asthma   . GERD (gastroesophageal reflux disease)   . Migraine without aura   . Von Willebrand disease (HCC)   . Von Willebrand disease [facility](HCC)     Patient Active Problem List   Diagnosis Date Noted  . Asthma exacerbation 06/14/2018  . MDD (major depressive disorder), single episode, moderate (HCC) 11/29/2017  . Acute upper respiratory infection 10/22/2016  . RUQ pain 09/21/2016  . GAD (generalized anxiety disorder) 10/09/2015  . Preventative health care 02/27/2015  . GERD (gastroesophageal reflux disease)   . Cold-induced asthma   . Von Willebrand disease (HCC)   . Migraine without aura     History reviewed. No pertinent surgical history.  Prior to Admission medications   Medication Sig Start Date End Date Taking? Authorizing Provider  albuterol (PROVENTIL HFA;VENTOLIN HFA) 108 (90 Base) MCG/ACT inhaler INHALE 2 PUFFS INTO THE LUNGS 2 (TWO) TIMES DAILY AS NEEDED FOR WHEEZING OR SHORTNESS OF BREATH. 09/15/18  Yes Tillman AbideLetvak, Richard I, MD  DULoxetine  (CYMBALTA) 60 MG capsule TAKE 1 CAPSULE BY MOUTH EVERY DAY 08/12/18  Yes Tillman AbideLetvak, Richard I, MD  escitalopram (LEXAPRO) 20 MG tablet TAKE 1 TABLET BY MOUTH EVERY DAY 10/12/18  Yes Karie SchwalbeLetvak, Richard I, MD  fluticasone (FLOVENT HFA) 110 MCG/ACT inhaler Inhale 2 puffs into the lungs 2 (two) times daily. With spacer.Rinse mouth after 06/14/18  Yes Karie SchwalbeLetvak, Richard I, MD  montelukast (SINGULAIR) 10 MG tablet Take 1 tablet (10 mg total) by mouth at bedtime. 06/14/18  Yes Karie SchwalbeLetvak, Richard I, MD  omeprazole (PRILOSEC) 20 MG capsule TAKE 1 CAPSULE (20 MG TOTAL) BY MOUTH 2 (TWO) TIMES DAILY. 01/03/18  Yes Tillman AbideLetvak, Richard I, MD  albuterol (PROVENTIL) (2.5 MG/3ML) 0.083% nebulizer solution TAKE 3 MILLILITERS (2.5 MG TOTAL) BY NEBULIZATION EVERY 6 (SIX) HOURS AS NEEDED. Patient not taking: Reported on 09/20/2018 08/25/17   Karie SchwalbeLetvak, Richard I, MD  predniSONE (DELTASONE) 50 MG tablet Take 1 tablet (50 mg total) by mouth daily for 4 days. 10/14/18 10/18/18  Merrily Brittleifenbark, Uzziah Rigg, MD    Allergies Patient has no known allergies.  Family History  Problem Relation Age of Onset  . Ulcerative colitis Sister     Social History Social History   Tobacco Use  . Smoking status: Never Smoker  . Smokeless tobacco: Never Used  Substance Use Topics  . Alcohol use: Yes    Alcohol/week: 0.0 standard drinks  . Drug use: No    Review of Systems Constitutional: No fever/chills Eyes: No visual changes. ENT: Positive for congestion Cardiovascular: Denies chest pain. Respiratory: Positive for shortness of breath. Gastrointestinal:  No abdominal pain.  No nausea, no vomiting.  No diarrhea.  No constipation. Genitourinary: Negative for dysuria. Musculoskeletal: Negative for back pain. Skin: Negative for rash. Neurological: Negative for headaches, focal weakness or numbness.   ____________________________________________   PHYSICAL EXAM:  VITAL SIGNS: ED Triage Vitals  Enc Vitals Group     BP 10/14/18 0151 111/64      Pulse Rate 10/14/18 0151 (!) 130     Resp 10/14/18 0151 (!) 25     Temp 10/14/18 0151 100.1 F (37.8 C)     Temp Source 10/14/18 0151 Oral     SpO2 10/14/18 0151 94 %     Weight 10/14/18 0149 275 lb 9.2 oz (125 kg)     Height --      Head Circumference --      Peak Flow --      Pain Score 10/14/18 0149 3     Pain Loc --      Pain Edu? --      Excl. in GC? --     Constitutional: Alert and oriented x4 appears obviously somewhat short of breath although not using accessory muscles Eyes: PERRL EOMI. Head: Atraumatic. Nose: Positive for congestion Mouth/Throat: No trismus Neck: No stridor.   Cardiovascular: Tachycardic rate, regular rhythm. Grossly normal heart sounds.  Good peripheral circulation. Respiratory: Increased respiratory effort with expiratory wheezing throughout with prolonged expiratory phase.  Lung sounds are equal bilaterally Gastrointestinal: Soft nontender Musculoskeletal: No lower extremity edema   Neurologic:  Normal speech and language. No gross focal neurologic deficits are appreciated. Skin:  Skin is warm, dry and intact. No rash noted. Psychiatric: Mood and affect are normal. Speech and behavior are normal.    ____________________________________________   DIFFERENTIAL includes but not limited to  Asthma exacerbation, pneumonia, pneumothorax, pulmonary embolism ____________________________________________   LABS (all labs ordered are listed, but only abnormal results are displayed)  Labs Reviewed - No data to display   __________________________________________  EKG  ED ECG REPORT I, Merrily BrittleNeil Miller [place], the attending physician, personally viewed and interpreted this ECG.  Date: 10/17/2018 EKG Time:  Rate: 127 Rhythm: Sinus tachycardia QRS Axis: Rightward axis Intervals: normal ST/T Wave abnormalities: normal Narrative Interpretation: no evidence of acute ischemia.  I appreciate the patient's rightward axis but he had this axis in 2016 and is  likely secondary to his chronic lung disease  ____________________________________________  RADIOLOGY  Chest x-ray reviewed by me with no acute disease noted ____________________________________________   PROCEDURES  Procedure(s) performed: no  Procedures  Critical Care performed: no  ____________________________________________   INITIAL IMPRESSION / ASSESSMENT AND PLAN / ED COURSE  Pertinent labs & imaging results that were available during my care of the patient were reviewed by me and considered in my medical decision making (see chart for details).   As part of my medical decision making, I reviewed the following data within the electronic MEDICAL RECORD NUMBER History obtained from family if available, nursing notes, old chart and ekg, as well as notes from prior ED visits.  The patient comes to the emergency department with upper respiratory symptoms and shortness of breath with prolonged expiratory phase consistent with acute asthma exacerbation.  He has a low-grade fever which is likely the etiology of his exacerbation.  I have given him 3 duo nebs here along with Tylenol, ketorolac, IV fluids for insensible losses, and a bolus of magnesium for the broncho-dilatory effects.  Following this he feels significantly improved.  I appreciate that his oxygen saturation is  actually paradoxically gone down but this is likely secondary to VQ mismatch.  He feels improved and would like to go home.  I have prescribed 4 more days of prednisone and strict return precautions have been given.      ____________________________________________   FINAL CLINICAL IMPRESSION(S) / ED DIAGNOSES  Final diagnoses:  Moderate persistent asthma with exacerbation      NEW MEDICATIONS STARTED DURING THIS VISIT:  Discharge Medication List as of 10/14/2018  3:32 AM    START taking these medications   Details  predniSONE (DELTASONE) 50 MG tablet Take 1 tablet (50 mg total) by mouth daily for 4  days., Starting Fri 10/14/2018, Until Tue 10/18/2018, Print         Note:  This document was prepared using Dragon voice recognition software and may include unintentional dictation errors.    Merrily Brittle, MD 10/17/18 [phone]

## 2018-10-14 NOTE — ED Notes (Signed)
Provider at bedside

## 2018-10-14 NOTE — ED Triage Notes (Signed)
Patient c/o SOB beginning today. Patient reports hx of asthma. Patient reports multiple nebulizer uses and inhaler administrations today. Patient c/o right-sided chest pain.

## 2018-10-14 NOTE — ED Notes (Signed)
Patient transported to X-ray 

## 2018-10-18 ENCOUNTER — Telehealth: Payer: Self-pay

## 2018-10-18 NOTE — Telephone Encounter (Signed)
Left message per DPR to see how he was doing after recent ER visit for breathing issues.

## 2018-11-10 ENCOUNTER — Other Ambulatory Visit: Payer: Self-pay | Admitting: Internal Medicine

## 2018-11-21 ENCOUNTER — Other Ambulatory Visit: Payer: Self-pay | Admitting: Internal Medicine

## 2018-12-14 ENCOUNTER — Other Ambulatory Visit: Payer: Self-pay | Admitting: Internal Medicine

## 2019-02-12 ENCOUNTER — Other Ambulatory Visit: Payer: Self-pay | Admitting: Internal Medicine

## 2019-02-13 ENCOUNTER — Telehealth: Payer: Self-pay

## 2019-02-13 NOTE — Telephone Encounter (Signed)
Spoke to pt. Advised him he needed to make a VV with Dr Alphonsus Sias to F/U his Depression. Pt said he would call back this week and schedule an appt.

## 2019-02-15 ENCOUNTER — Other Ambulatory Visit: Payer: Self-pay

## 2019-02-15 MED ORDER — OMEPRAZOLE 20 MG PO CPDR: 20.0000 mg | DELAYED_RELEASE_CAPSULE | Freq: Two times a day (BID) | ORAL | 11 refills | Status: DC

## 2019-02-15 NOTE — Telephone Encounter (Signed)
Patient uses [facility] on [address] in [place] now.

## 2019-02-23 ENCOUNTER — Ambulatory Visit (INDEPENDENT_AMBULATORY_CARE_PROVIDER_SITE_OTHER): Payer: Managed Care, Other (non HMO) | Admitting: Internal Medicine

## 2019-02-23 ENCOUNTER — Encounter: Payer: Self-pay | Admitting: Internal Medicine

## 2019-02-23 ENCOUNTER — Other Ambulatory Visit: Payer: Self-pay

## 2019-02-23 DIAGNOSIS — J4530 Mild persistent asthma, uncomplicated: Secondary | ICD-10-CM | POA: Diagnosis not present

## 2019-02-23 DIAGNOSIS — F321 Major depressive disorder, single episode, moderate: Secondary | ICD-10-CM | POA: Diagnosis not present

## 2019-02-23 NOTE — Assessment & Plan Note (Signed)
He is doing well now but has frequent occupational exposure to dust, etc----so uses his rescue inhaler most days (once) Discussed the black box warning for montelukast----we both agree to continue for now but if he has exacerbation of depression, would stop

## 2019-02-23 NOTE — Assessment & Plan Note (Signed)
He clearly seems to be in remission now---for about a year He has chronic depression that started in high school or earlier Discussed that we would want to continue at least one of the medications ---probably indefinitely Right now, he is most comfortable with continuing both

## 2019-02-23 NOTE — Progress Notes (Signed)
Subjective:    Patient ID: Newman NipJames M Grunow, male    DOB: Mar 10, 1986, 33 y.o.   MRN: [id number]  HPI Virtual visit for follow up of depression and asthma Identification done Reviewed billing and he gave consent He is in his truck--and I am at my office  He has been working "non stop"  Asthma is good Uses the flovent and montelukast every day Doesn't need the albuterol regularly---no more than once a day (works with chain saws and respirators)  Mood has been good No regular depression No anxiety attacks  Continues on the 2 medications--he is comfortable being on both of them  Current Outpatient Medications on File Prior to Visit  Medication Sig Dispense Refill  . albuterol (PROVENTIL HFA;VENTOLIN HFA) 108 (90 Base) MCG/ACT inhaler INHALE 2 PUFFS INTO THE LUNGS TWICE DAILY AS NEEDED FOR WHEEZING OR SHORTNESS OF BREATH. 18 Inhaler 0  . albuterol (PROVENTIL) (2.5 MG/3ML) 0.083% nebulizer solution TAKE 3 MILLILITERS (2.5 MG TOTAL) BY NEBULIZATION EVERY 6 (SIX) HOURS AS NEEDED. 75 mL 0  . DULoxetine (CYMBALTA) 60 MG capsule TAKE 1 CAPSULE BY MOUTH EVERY DAY 90 capsule 0  . escitalopram (LEXAPRO) 20 MG tablet TAKE 1 TABLET BY MOUTH EVERY DAY 90 tablet 0  . fluticasone (FLOVENT HFA) 110 MCG/ACT inhaler Inhale 2 puffs into the lungs 2 (two) times daily. With spacer.Rinse mouth after 1 Inhaler 12  . montelukast (SINGULAIR) 10 MG tablet TAKE 1 TABLET (10 MG TOTAL) BY MOUTH AT BEDTIME. 90 tablet 0  . omeprazole (PRILOSEC) 20 MG capsule Take 1 capsule (20 mg total) by mouth 2 (two) times daily. 60 capsule 11   No current facility-administered medications on file prior to visit.     No Known Allergies  Past Medical History:  Diagnosis Date  . Asthma   . Cold-induced asthma   . GERD (gastroesophageal reflux disease)   . Migraine without aura   . Von Willebrand disease (HCC)   . Von Willebrand disease (HCC)     History reviewed. No pertinent surgical history.  Family History   Problem Relation Age of Onset  . Ulcerative colitis Sister     Social History   Socioeconomic History  . Marital status: Married    Spouse name: Not on file  . Number of children: 2  . Years of education: Not on file  . Highest education level: Not on file  Occupational History  . 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 Financial resource strain: Not on file  . Food insecurity:    Worry: Not on file    Inability: Not on file  . Transportation needs:    Medical: Not on file    Non-medical: Not on file  Tobacco Use  . Smoking status: Never Smoker  . Smokeless tobacco: Never Used  Substance and Sexual Activity  . Alcohol use: Yes    Alcohol/week: 0.0 standard drinks  . Drug use: No  . Sexual activity: Not on file  Lifestyle  . Physical activity:    Days per week: Not on file    Minutes per session: Not on file  . Stress: Not on file  Relationships  . Social connections:    Talks on phone: Not on file    Gets together: Not on file    Attends religious service: Not on file    Active member of club or organization: Not on file    Attends meetings of clubs  or organizations: Not on file    Relationship status: Not on file  . Intimate partner violence:    Fear of current or ex partner: Not on file    Emotionally abused: Not on file    Physically abused: Not on file    Forced sexual activity: Not on file  Other Topics Concern  . Not on file  Social History Narrative  . Not on file   Review of Systems Sleeping well Appetite is "too good" Weight is still up higher than he wants--regained all the weight he lost with the severe depression Bought and moved into new house    Objective:   Physical Exam  Constitutional: He appears well-developed. No distress.  Respiratory: Effort normal. No respiratory distress.  Psychiatric: He has a normal mood and affect. His behavior is normal.            Assessment & Plan:

## 2019-03-04 ENCOUNTER — Other Ambulatory Visit: Payer: Self-pay | Admitting: Internal Medicine

## 2019-03-07 ENCOUNTER — Other Ambulatory Visit: Payer: Self-pay | Admitting: Internal Medicine

## 2019-06-23 ENCOUNTER — Telehealth: Payer: Self-pay

## 2019-06-23 NOTE — Telephone Encounter (Signed)
Pt's wife notified as instructed and voiced understanding. Will start quarantining and will go for testing in [place] on 06/26/19.

## 2019-06-23 NOTE — Telephone Encounter (Signed)
Pt's wife calling; pts son was sent home on 06/20/19 to be quarantined for possible exposure at the daycare. Pt's wife said initially it was a parent of a child or a colleague of a parent that was + for covid. Then [facility] Dept tracing dept called pt today and was told it was a direct contact with pts son. Pt s wife has left message at local [facility] Dept to see what level of exposure her son had but pt's wife is concerned because her husband has been working all wk. And her daughter's room at the same daycare was not quarantined. I advised since they live in same home and do not wear mask in home all family members should be quarantined. Pt's wife said that if it was a parent or colleague of a parent she did not think they all should be quarantined. Pt's wife also wants to know how to find out for sure the level of contact. I advised pt to contact daycare and health dept for that info. Pt's wife wants note to be sent to provider to see if they have other suggestions and does entire family need covid testing.

## 2019-06-23 NOTE — Telephone Encounter (Signed)
If one person in the household had direct exposure to someone who tested positive, all household members should be quarantined till a negative COVID test. Please direct them all to be tested today to limit the time of quarantine if they are negative

## 2019-06-27 ENCOUNTER — Other Ambulatory Visit: Payer: Self-pay

## 2019-06-27 DIAGNOSIS — Z20822 Contact with and (suspected) exposure to covid-19: Secondary | ICD-10-CM

## 2019-06-28 LAB — NOVEL CORONAVIRUS, NAA: SARS-CoV-2, NAA: NOT DETECTED

## 2019-08-11 IMAGING — CR DG CHEST 2V
3 series · 3 of 3 positions shown · non-contrast
Comparison: None.

CLINICAL DATA: Shortness of breath

EXAM:
CHEST - 2 VIEW

[chest pa]
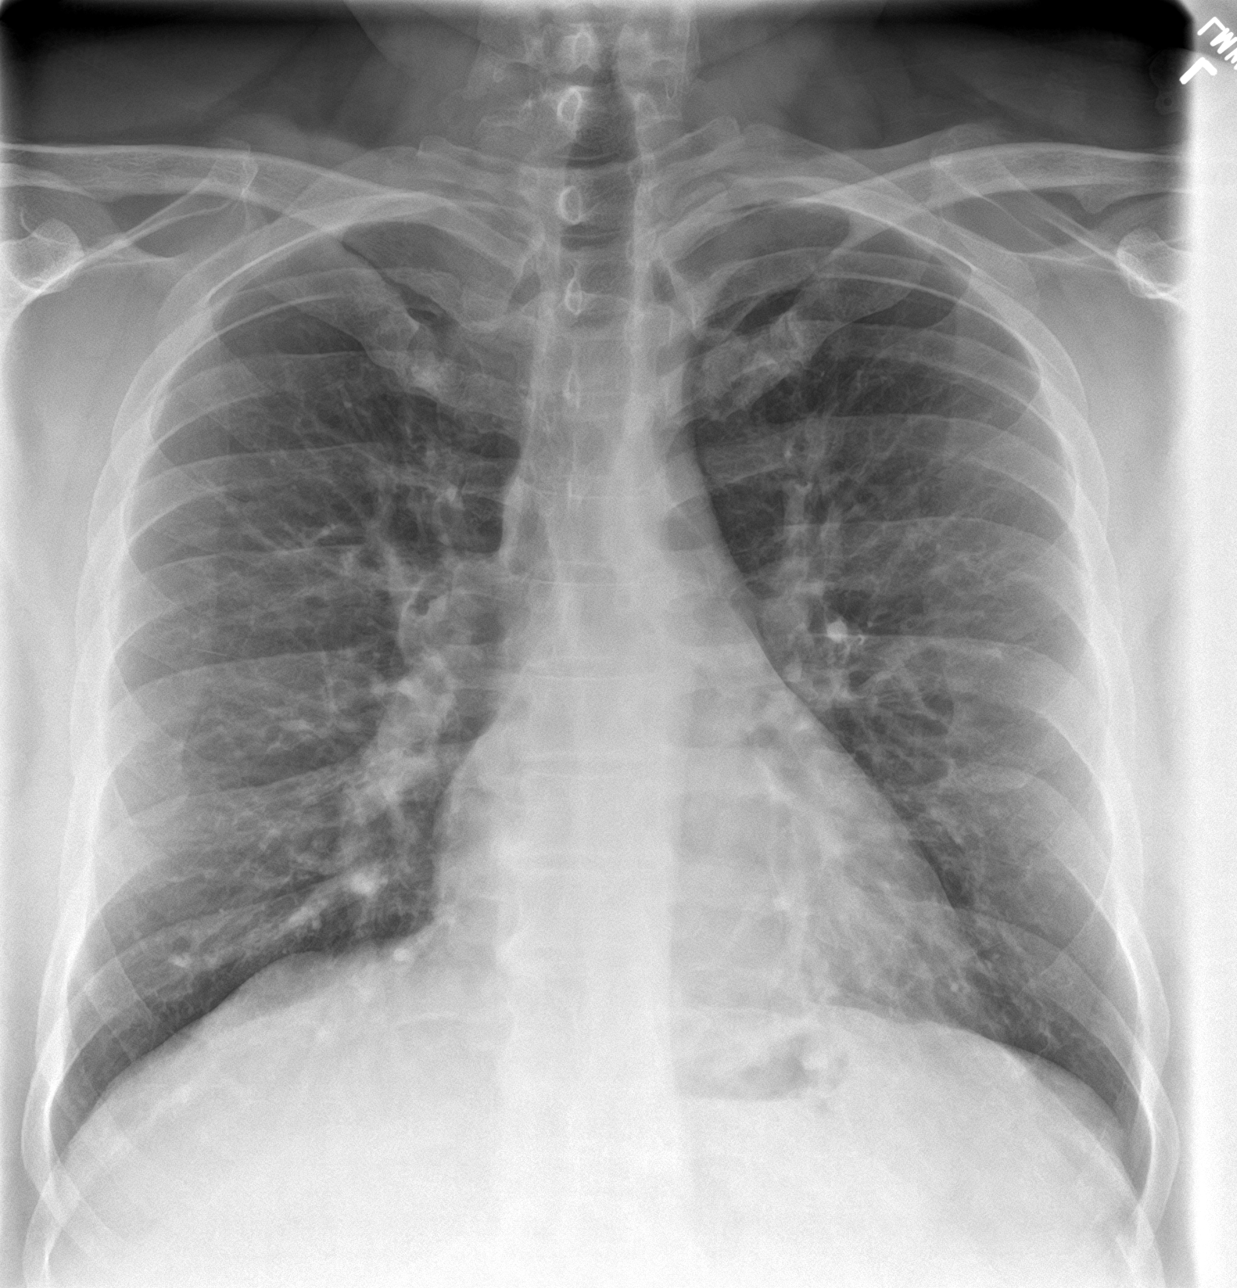

[chest lat (1 of 2)]
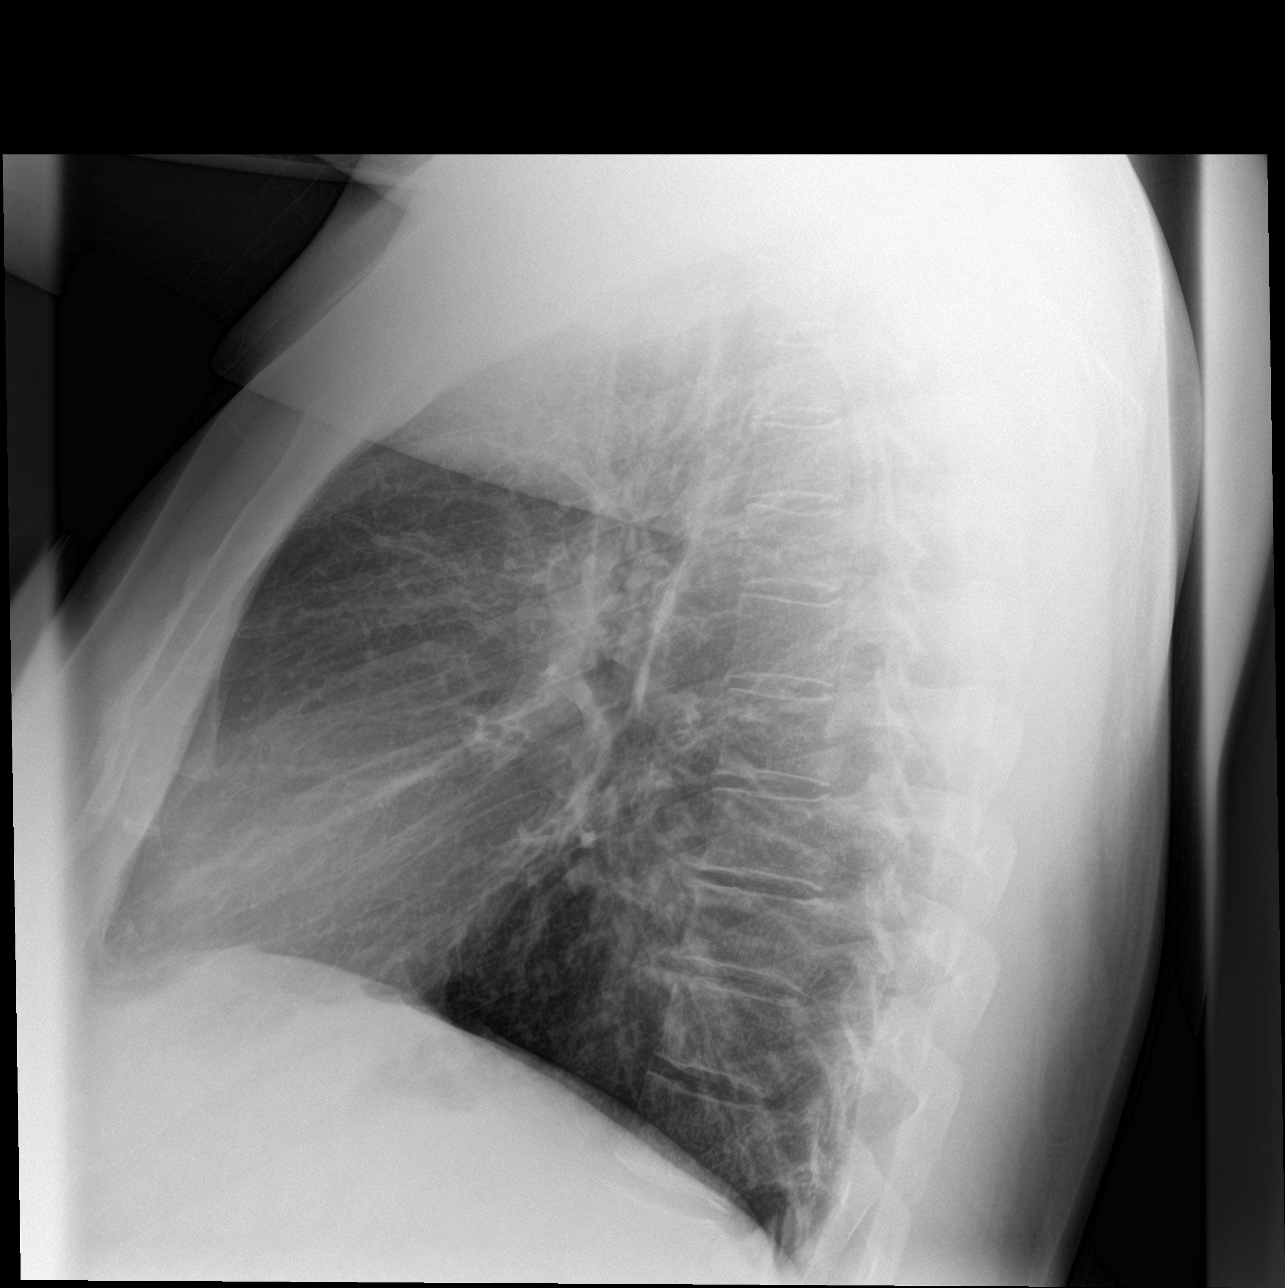

[chest lat (2 of 2)]
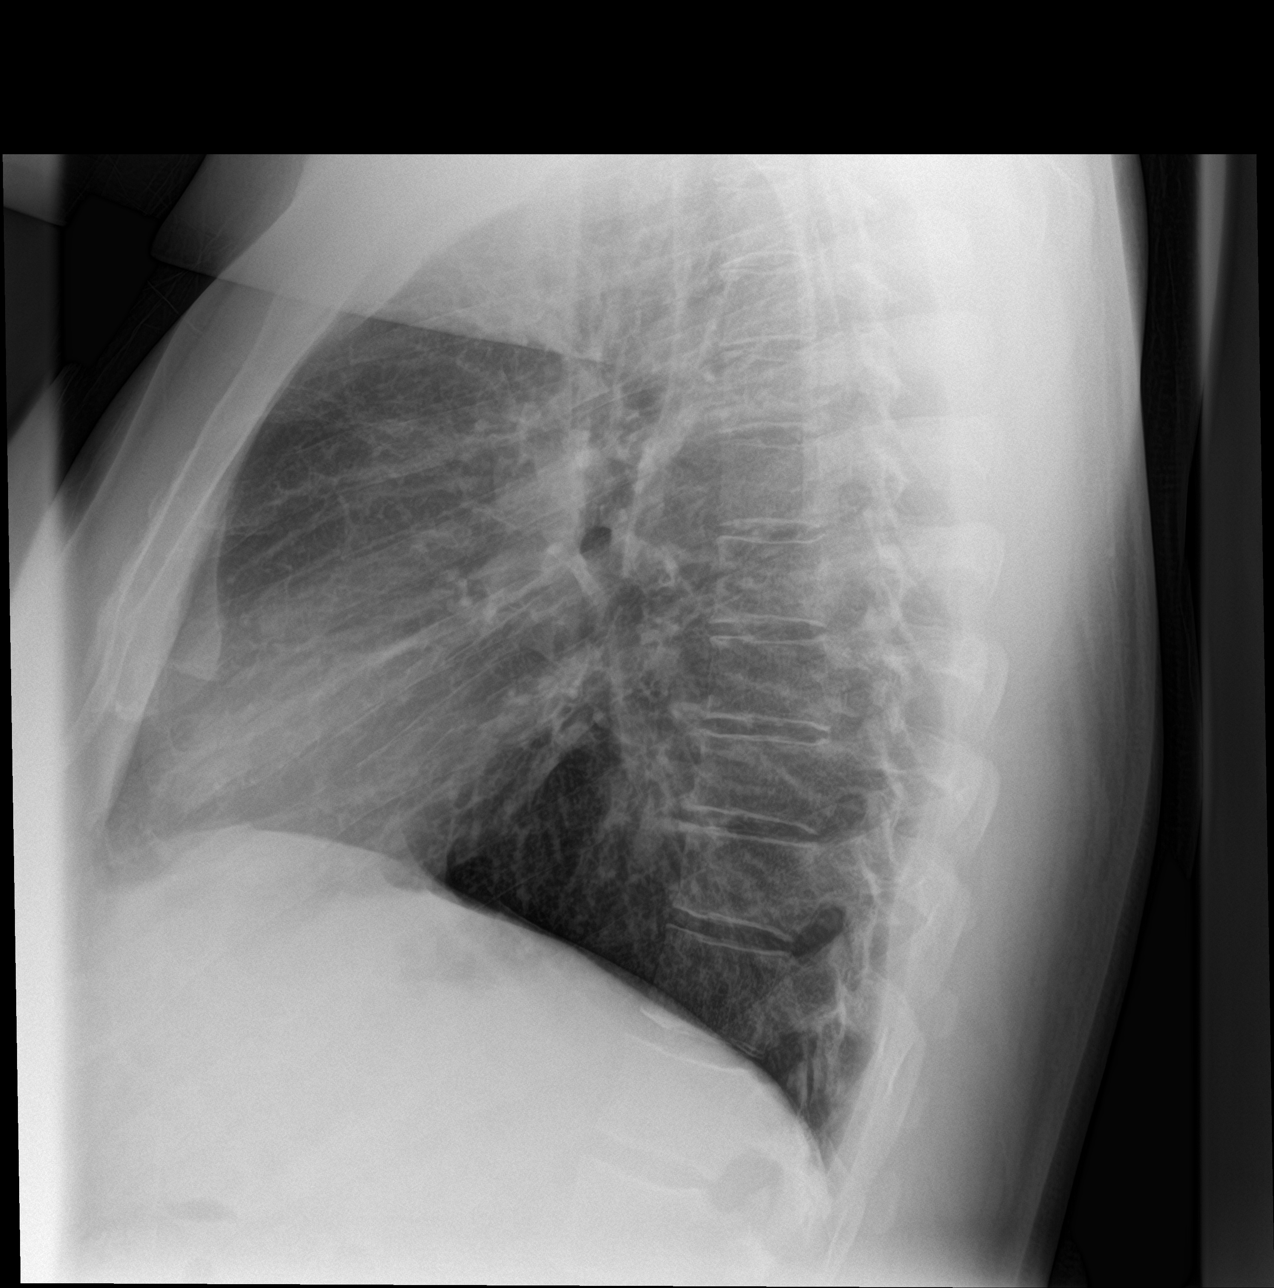

[3 of 3 positions shown; findings below may reference images not displayed]

FINDINGS: The heart size and mediastinal contours are within normal limits.
Both lungs are clear. The visualized skeletal structures are
unremarkable.
IMPRESSION: No active cardiopulmonary disease.

## 2019-08-24 ENCOUNTER — Ambulatory Visit (INDEPENDENT_AMBULATORY_CARE_PROVIDER_SITE_OTHER): Payer: Managed Care, Other (non HMO)

## 2019-08-24 DIAGNOSIS — Z23 Encounter for immunization: Secondary | ICD-10-CM | POA: Diagnosis not present

## 2019-08-28 ENCOUNTER — Encounter: Payer: Self-pay | Admitting: Internal Medicine

## 2019-08-28 ENCOUNTER — Other Ambulatory Visit: Payer: Self-pay

## 2019-08-28 ENCOUNTER — Ambulatory Visit (INDEPENDENT_AMBULATORY_CARE_PROVIDER_SITE_OTHER): Payer: Managed Care, Other (non HMO) | Admitting: Internal Medicine

## 2019-08-28 VITALS — BP 116/76 | HR 84 | Temp 97.5°F | Ht 73.0 in | Wt 290.5 lb

## 2019-08-28 DIAGNOSIS — J4530 Mild persistent asthma, uncomplicated: Secondary | ICD-10-CM

## 2019-08-28 DIAGNOSIS — F3340 Major depressive disorder, recurrent, in remission, unspecified: Secondary | ICD-10-CM

## 2019-08-28 DIAGNOSIS — Z0000 Encounter for general adult medical examination without abnormal findings: Secondary | ICD-10-CM | POA: Diagnosis not present

## 2019-08-28 DIAGNOSIS — K219 Gastro-esophageal reflux disease without esophagitis: Secondary | ICD-10-CM

## 2019-08-28 NOTE — Assessment & Plan Note (Signed)
Sounds like he had a spell upon awakening this weekend Continue the PPI

## 2019-08-28 NOTE — Progress Notes (Addendum)
Subjective:    Patient ID: Eric Norris, male    DOB: Oct 09, 1986, 33 y.o.   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 albuterol (PROVENTIL) (2.5 MG/3ML) 0.083% nebulizer solution TAKE 3 MILLILITERS (2.5 MG TOTAL) BY NEBULIZATION EVERY 6 (SIX) HOURS AS NEEDED. 75 mL 0  . albuterol (VENTOLIN HFA) 108 (90 Base) MCG/ACT inhaler INHALE 2 PUFFS INTO THE LUNGS TWICE DAILY AS NEEDED FOR WHEEZING OR SHORTNESS OF BREATH. 18 Inhaler 5  . DULoxetine (CYMBALTA) 60 MG capsule TAKE 1 CAPSULE BY MOUTH EVERY DAY 90 capsule 0  . escitalopram (LEXAPRO) 20 MG tablet TAKE 1 TABLET BY MOUTH EVERY DAY 90 tablet 3  . fluticasone (FLOVENT HFA) 110 MCG/ACT inhaler Inhale 2 puffs into the lungs 2 (two) times daily. With spacer.Rinse mouth after 1 Inhaler 12  . montelukast (SINGULAIR) 10 MG tablet TAKE 1 TABLET (10 MG TOTAL) BY MOUTH AT BEDTIME. 90 tablet 0  . omeprazole (PRILOSEC) 20 MG capsule Take 1 capsule (20 mg total) by mouth 2 (two) times daily. 60 capsule 11   No current facility-administered medications on file prior to visit.     No Known Allergies  Past Medical History:  Diagnosis Date  . Asthma   . Cold-induced asthma   . GERD (gastroesophageal reflux disease)   . Migraine without aura   . Von Willebrand disease ([place])   . Von Willebrand  disease ([place])     History reviewed. No pertinent surgical history.  Family History  Problem Relation Age of Onset  . Ulcerative colitis Sister     Social History   Socioeconomic History  . Marital status: Married    Spouse name: Not on file  . Number of children: 2  . Years of education: Not on file  . Highest education level: Not on file  Occupational History  . Occupation: Refinishing--bathtubs, counters, etc    [employer]  Social Needs  . Financial resource strain: Not on file  . Food insecurity    Worry: Not on file    Inability: Not on file  . Transportation needs    Medical: Not on file    Non-medical: Not on file  Tobacco Use  . Smoking status: Never Smoker  . Smokeless tobacco: Never Used  Substance and Sexual Activity  . Alcohol use: Yes    Alcohol/week: 0.0 standard drinks  . Drug use: No  . Sexual activity: Not on file  Lifestyle  . Physical activity    Days per week: Not on file    Minutes per session: Not on file  . Stress: Not on file  Relationships  . Social Herbalist on phone: Not on file    Gets together: Not on file    Attends religious service: Not on file    Active member of club  or organization: Not on file    Attends meetings of clubs or organizations: Not on file    Relationship status: Not on file  . Intimate partner violence    Fear of current or ex partner: Not on file    Emotionally abused: Not on file    Physically abused: Not on file    Forced sexual activity: Not on file  Other Topics Concern  . Not on file  Social History Narrative  . Not on file   Review of Systems  Constitutional: Positive for unexpected weight change. Negative for fatigue.       Weight up again some Wears seat belt  HENT: Negative for dental problem, hearing loss and tinnitus.        Will reschedule with dentist  Eyes: Negative for visual disturbance.       No diplopia or unilateral vision loss  Respiratory:  Positive for wheezing. Negative for cough, chest tightness and shortness of breath.   Cardiovascular: Negative for chest pain, palpitations and leg swelling.  Gastrointestinal: Negative for blood in stool and constipation.  Endocrine: Negative for polydipsia and polyuria.  Genitourinary: Negative for difficulty urinating and urgency.       No sexual problems  Musculoskeletal: Negative for arthralgias, back pain and joint swelling.  Skin: Negative for rash.       No suspicious lesions Has spot on top of left foot--no pain  Allergic/Immunologic: Negative for environmental allergies and immunocompromised state.  Neurological: Positive for headaches. Negative for dizziness, syncope and light-headedness.       Goody powders help ~ twice a month headaches  Hematological: Negative for adenopathy. Does not bruise/bleed easily.  Psychiatric/Behavioral: Negative for sleep disturbance. The patient is not nervous/anxious.        Snores a lot       Objective:   Physical Exam  Constitutional: He is oriented to person, place, and time. He appears well-developed. No distress.  HENT:  Head: Normocephalic and atraumatic.  Right Ear: External ear normal.  Left Ear: External ear normal.  Mouth/Throat: Oropharynx is clear and moist. No oropharyngeal exudate.  Eyes: Pupils are equal, round, and reactive to light. Conjunctivae are normal.  Neck: No thyromegaly present.  Cardiovascular: Normal rate, regular rhythm, normal heart sounds and intact distal pulses. Exam reveals no gallop.  No murmur heard. Respiratory: Effort normal and breath sounds normal. No respiratory distress. He has no wheezes. He has no rales.  GI: Soft. There is no abdominal tenderness.  Musculoskeletal:        General: No tenderness or edema.  Lymphadenopathy:    He has no cervical adenopathy.  Neurological: He is alert and oriented to person, place, and time.  Skin: No rash noted. No erythema.  ~2 x 2 cm well circumscribed cyst  vs lipoma on dorsum of left foot (reassured)  Psychiatric: He has a normal mood and affect. His behavior is normal.           Assessment & Plan:

## 2019-08-28 NOTE — Assessment & Plan Note (Signed)
Controlled with his Rx

## 2019-08-28 NOTE — Assessment & Plan Note (Signed)
Healthy but needs to work on fitness and better eating Had flu vaccine

## 2019-08-28 NOTE — Patient Instructions (Signed)

## 2019-08-28 NOTE — Assessment & Plan Note (Signed)
Will continue dual therapy (indefinitely) due to recurrences and severity

## 2019-08-29 LAB — COMPREHENSIVE METABOLIC PANEL
ALT: 17 U/L (ref 0–53)
AST: 15 U/L (ref 0–37)
Albumin: 4.4 g/dL (ref 3.5–5.2)
Alkaline Phosphatase: 80 U/L (ref 39–117)
BUN: 15 mg/dL (ref 6–23)
CO2: 28 mEq/L (ref 19–32)
Calcium: 9.2 mg/dL (ref 8.4–10.5)
Chloride: 101 mEq/L (ref 96–112)
Creatinine, Ser: 0.97 mg/dL (ref 0.40–1.50)
GFR: 88.96 mL/min (ref >60.00)
Glucose, Bld: 91 mg/dL (ref 70–99)
Potassium: 4.4 mEq/L (ref 3.5–5.1)
Sodium: 136 mEq/L (ref 135–145)
Total Bilirubin: 0.3 mg/dL (ref 0.2–1.2)
Total Protein: 7.2 g/dL (ref 6.0–8.3)

## 2019-08-29 LAB — LIPID PANEL
Cholesterol: 192 mg/dL (ref 0–200)
HDL: 40.4 mg/dL (ref >39.00)
NonHDL: 151.15
Total CHOL/HDL Ratio: 5
Triglycerides: 256 mg/dL — ABNORMAL HIGH (ref 0.0–149.0)
VLDL: 51.2 mg/dL — ABNORMAL HIGH (ref 0.0–40.0)

## 2019-08-29 LAB — CBC
HCT: 42.9 % (ref 39.0–52.0)
Hemoglobin: 14.3 g/dL (ref 13.0–17.0)
MCHC: 33.3 g/dL (ref 30.0–36.0)
MCV: 86.7 fl (ref 78.0–100.0)
Platelets: 343 10*3/uL (ref 150.0–400.0)
RBC: 4.94 Mil/uL (ref 4.22–5.81)
RDW: 13.5 % (ref 11.5–15.5)
WBC: 10.6 10*3/uL — ABNORMAL HIGH (ref 4.0–10.5)

## 2019-08-29 LAB — LDL CHOLESTEROL, DIRECT: Direct LDL: 113 mg/dL

## 2019-10-17 ENCOUNTER — Other Ambulatory Visit: Payer: Self-pay | Admitting: Internal Medicine

## 2019-11-09 ENCOUNTER — Other Ambulatory Visit: Payer: Self-pay | Admitting: Internal Medicine

## 2019-11-11 ENCOUNTER — Other Ambulatory Visit: Payer: Self-pay | Admitting: Internal Medicine

## 2020-02-12 ENCOUNTER — Encounter (HOSPITAL_COMMUNITY): Payer: Self-pay | Admitting: *Deleted

## 2020-02-12 ENCOUNTER — Emergency Department (HOSPITAL_COMMUNITY)
Admission: EM | Admit: 2020-02-12 | Discharge: 2020-02-13 | Disposition: A | Discharge status: Home / Self Care | Payer: Managed Care, Other (non HMO) | Attending: Emergency Medicine | Admitting: Emergency Medicine

## 2020-02-12 ENCOUNTER — Other Ambulatory Visit: Payer: Self-pay

## 2020-02-12 DIAGNOSIS — R200 Anesthesia of skin: Secondary | ICD-10-CM | POA: Insufficient documentation

## 2020-02-12 DIAGNOSIS — Z5321 Procedure and treatment not carried out due to patient leaving prior to being seen by health care provider: Secondary | ICD-10-CM | POA: Insufficient documentation

## 2020-02-12 DIAGNOSIS — F419 Anxiety disorder, unspecified: Secondary | ICD-10-CM | POA: Insufficient documentation

## 2020-02-12 HISTORY — DX: Depression, unspecified: F32.A

## 2020-02-12 HISTORY — DX: Anxiety disorder, unspecified: F41.9

## 2020-02-12 MED ORDER — SODIUM CHLORIDE 0.9% FLUSH: 3.0000 mL | Freq: Once | INTRAVENOUS | Status: DC

## 2020-02-12 NOTE — ED Notes (Signed)
Called twice for blood draw 

## 2020-02-12 NOTE — ED Triage Notes (Addendum)
Pt states he was driving when he had ? Anxiety, numbness in feet and hands as well as around mouth. States he felt like it may be related to his asthma. At present symptoms have resolved with tingling, states he is nauseated with abd pain.Eric Norris

## 2020-02-12 NOTE — ED Notes (Signed)
No answer when called to update vital signs. 

## 2020-02-12 NOTE — ED Notes (Signed)
Called a 3rd time for blood draw.

## 2020-02-14 ENCOUNTER — Telehealth: Payer: Self-pay

## 2020-02-14 NOTE — Telephone Encounter (Signed)
Karie Schwalbe, MD  Eual Fines, CMA  Went to ER yesterday--but left before being seen  Please check on him and document in a phone note   Thanks!   Left message for patient to call back and follow up

## 2020-02-16 NOTE — Telephone Encounter (Signed)
Spoke to pt. He said he was seen and they told him he had an asthma and panic attack. He left after 30 minutes because he was feeling better. He did not see a PA or Dr. York Spaniel he feels fine now.

## 2020-03-20 ENCOUNTER — Telehealth: Payer: Self-pay | Admitting: Internal Medicine

## 2020-03-20 NOTE — Telephone Encounter (Signed)
error 

## 2020-04-19 ENCOUNTER — Ambulatory Visit: Payer: Managed Care, Other (non HMO) | Attending: Internal Medicine

## 2020-04-19 ENCOUNTER — Other Ambulatory Visit: Payer: Self-pay | Admitting: Internal Medicine

## 2020-04-19 DIAGNOSIS — Z23 Encounter for immunization: Secondary | ICD-10-CM

## 2020-04-19 NOTE — Progress Notes (Signed)
   Covid-19 Vaccination Clinic  Name:  JENSON BEEDLE    MRN: [id number] DOB: 26-Feb-1986  04/19/2020  Mr. Zoeller was observed post Covid-19 immunization for 15 minutes without incident. He was provided with Vaccine Information Sheet and instruction to access the V-Safe system.   Mr. Parcel was instructed to call 911 with any severe reactions post vaccine: Marland Kitchen Difficulty breathing  . Swelling of face and throat  . A fast heartbeat  . A bad rash all over body  . Dizziness and weakness   Immunizations Administered    Name Date Dose VIS Date Route   Pfizer COVID-19 Vaccine 04/19/2020  2:37 PM 0.3 mL 12/20/2018 Intramuscular   Manufacturer: [employer], [employer]   Lot: [id number]   NDC: [address]-0

## 2020-05-10 ENCOUNTER — Ambulatory Visit: Payer: Managed Care, Other (non HMO) | Attending: Internal Medicine

## 2020-05-10 DIAGNOSIS — Z23 Encounter for immunization: Secondary | ICD-10-CM

## 2020-05-10 NOTE — Progress Notes (Signed)
   Covid-19 Vaccination [facility]  Name:  RODNEY YERA    MRN: [id number] DOB: 06-14-1986  05/10/2020  Mr. Barsky was observed post Covid-19 immunization for 15 minutes without incident. He was provided with Vaccine Information Sheet and instruction to access the V-Safe system.   Mr. Beavers was instructed to call 911 with any severe reactions post vaccine: Marland Kitchen Difficulty breathing  . Swelling of face and throat  . A fast heartbeat  . A bad rash all over body  . Dizziness and weakness   Immunizations Administered    Name Date Dose VIS Date Route   Pfizer COVID-19 Vaccine 05/10/2020  3:21 PM 0.3 mL 12/20/2018 Intramuscular   Manufacturer: [employer], [employer]   Lot: [id number]   NDC: [address]-8

## 2020-05-25 ENCOUNTER — Other Ambulatory Visit: Payer: Self-pay | Admitting: Internal Medicine

## 2020-06-21 ENCOUNTER — Encounter: Payer: Self-pay | Admitting: Internal Medicine

## 2020-06-21 ENCOUNTER — Other Ambulatory Visit: Payer: Self-pay

## 2020-06-21 ENCOUNTER — Ambulatory Visit (INDEPENDENT_AMBULATORY_CARE_PROVIDER_SITE_OTHER): Payer: Managed Care, Other (non HMO) | Admitting: Internal Medicine

## 2020-06-21 DIAGNOSIS — R198 Other specified symptoms and signs involving the digestive system and abdomen: Secondary | ICD-10-CM | POA: Insufficient documentation

## 2020-06-21 MED ORDER — FLUCONAZOLE 150 MG PO TABS: 150.0000 mg | ORAL_TABLET | Freq: Once | ORAL | 1 refills | Status: AC

## 2020-06-21 NOTE — Progress Notes (Signed)
Subjective:    Patient ID: Eric Norris, male    DOB: 1985/12/16, 34 y.o.   MRN: [id number]  HPI Here due to soreness and discharge from belly button This visit occurred during the SARS-CoV-2 public health emergency.  Safety protocols were in place, including screening questions prior to the visit, additional usage of staff PPE, and extensive cleaning of exam room while observing appropriate contact time as indicated for disinfecting solutions.   Started 2-3 weeks ago--maybe more Has had some small sores around umbilicus Some "heinous smelling" drainage Seems better now Mild skin redness---better now Occasional bleeding He just cleaned with warm salt water  Current Outpatient Medications on File Prior to Visit  Medication Sig Dispense Refill  . albuterol (PROVENTIL) (2.5 MG/3ML) 0.083% nebulizer solution TAKE 3 MILLILITERS (2.5 MG TOTAL) BY NEBULIZATION EVERY 6 (SIX) HOURS AS NEEDED. 75 mL 0  . albuterol (VENTOLIN HFA) 108 (90 Base) MCG/ACT inhaler INHALE 2 PUFFS INTO THE LUNGS TWICE DAILY AS NEEDED FOR WHEEZING OR SHORTNESS OF BREATH. 18 g 5  . DULoxetine (CYMBALTA) 60 MG capsule TAKE 1 CAPSULE BY MOUTH EVERY DAY 90 capsule 3  . escitalopram (LEXAPRO) 20 MG tablet TAKE 1 TABLET BY MOUTH EVERY DAY 90 tablet 3  . fluticasone (FLOVENT HFA) 110 MCG/ACT inhaler Inhale 2 puffs into the lungs 2 (two) times daily. With spacer.Rinse mouth after 1 Inhaler 12  . montelukast (SINGULAIR) 10 MG tablet TAKE 1 TABLET BY MOUTH AT BEDTIME 90 tablet 1  . omeprazole (PRILOSEC) 20 MG capsule Take 1 capsule (20 mg total) by mouth 2 (two) times daily before a meal. 180 capsule 3   No current facility-administered medications on file prior to visit.    No Known Allergies  Past Medical History:  Diagnosis Date  . Anxiety   . Asthma   . Cold-induced asthma   . Depression   . GERD (gastroesophageal reflux disease)   . Migraine without aura   . Von Willebrand disease (HCC)   . Von Willebrand  disease (HCC)     History reviewed. No pertinent surgical history.  Family History  Problem Relation Age of Onset  . Ulcerative colitis Sister     Social History   Socioeconomic History  . Marital status: Married    Spouse name: Not on file  . Number of children: 2  . Years of education: Not on file  . Highest education level: Not on file  Occupational History  . 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 Smoking status: Never Smoker  . Smokeless tobacco: Never Used  Substance and Sexual Activity  . Alcohol use: Yes    Alcohol/week: 0.0 standard drinks  . Drug use: No  . Sexual activity: Not on file  Other Topics Concern  . Not on file  Social History Narrative  . Not on file   Social Determinants of Health   Financial Resource Strain:   . Difficulty of Paying Living Expenses: Not on file  Food Insecurity:   . Worried About Programme researcher, broadcasting/film/video in the Last Year: Not on file  . Ran Out of Food in the Last Year: Not on file  Transportation Needs:   . Lack of Transportation (Medical): Not on file  . Lack of Transportation (Non-Medical): Not on file  Physical Activity:   . Days of Exercise per Week: Not on file  . Minutes of Exercise per Session: Not on file  Stress:   .  Feeling of Stress : Not on file  Social Connections:   . Frequency of Communication with Friends and Family: Not on file  . Frequency of Social Gatherings with Friends and Family: Not on file  . Attends Religious Services: Not on file  . Active Member of Clubs or Organizations: Not on file  . Attends Banker Meetings: Not on file  . Marital Status: Not on file  Intimate Partner Violence:   . Fear of Current or Ex-Partner: Not on file  . Emotionally Abused: Not on file  . Physically Abused: Not on file  . Sexually Abused: Not on file   Review of Systems No fever Not sick No exposures to this area    Objective:     Physical Exam Abdominal:     Palpations: Abdomen is soft.     Tenderness: There is no abdominal tenderness.     Comments: No umbilical redness, warmth or discharge Cotton tip applicator goes in 3-4cm but is clean            Assessment & Plan:

## 2020-06-21 NOTE — Assessment & Plan Note (Signed)
Likely fungal Better now with cleaning Discussed antifungal cream prn Will give fluconazole to take prn if acts back up again

## 2020-06-24 ENCOUNTER — Other Ambulatory Visit: Payer: Self-pay | Admitting: Internal Medicine

## 2020-08-21 ENCOUNTER — Other Ambulatory Visit: Payer: Self-pay

## 2020-08-21 ENCOUNTER — Ambulatory Visit: Payer: Managed Care, Other (non HMO)

## 2020-08-29 ENCOUNTER — Encounter: Payer: Managed Care, Other (non HMO) | Admitting: Internal Medicine

## 2020-09-09 ENCOUNTER — Other Ambulatory Visit: Payer: Self-pay | Admitting: Internal Medicine

## 2020-09-19 ENCOUNTER — Other Ambulatory Visit: Payer: Self-pay | Admitting: Internal Medicine

## 2020-10-07 ENCOUNTER — Other Ambulatory Visit: Payer: Self-pay | Admitting: Internal Medicine

## 2020-10-08 ENCOUNTER — Other Ambulatory Visit: Payer: Self-pay | Admitting: Internal Medicine

## 2020-10-31 ENCOUNTER — Other Ambulatory Visit: Payer: Self-pay | Admitting: Internal Medicine

## 2020-12-04 ENCOUNTER — Other Ambulatory Visit: Payer: Self-pay | Admitting: Internal Medicine

## 2020-12-18 ENCOUNTER — Other Ambulatory Visit: Payer: Self-pay | Admitting: Internal Medicine

## 2021-02-23 ENCOUNTER — Other Ambulatory Visit: Payer: Self-pay | Admitting: Internal Medicine

## 2021-03-11 ENCOUNTER — Other Ambulatory Visit: Payer: Self-pay | Admitting: Internal Medicine

## 2021-03-15 ENCOUNTER — Other Ambulatory Visit: Payer: Self-pay | Admitting: Internal Medicine

## 2021-03-18 NOTE — Telephone Encounter (Signed)
Left message for the patient to call back. Patient is over due for an appointment. Need this scheduled before refills can be provided.

## 2021-03-20 NOTE — Telephone Encounter (Signed)
We will not let him go without medication, but we will not fill 90 days as requested from the pharmacy.

## 2021-03-20 NOTE — Telephone Encounter (Signed)
Noted. That is what I meant but did not word it the right way. Thank you.

## 2021-03-27 ENCOUNTER — Other Ambulatory Visit: Payer: Self-pay | Admitting: Internal Medicine

## 2021-04-03 ENCOUNTER — Other Ambulatory Visit: Payer: Self-pay | Admitting: Internal Medicine

## 2021-04-10 ENCOUNTER — Ambulatory Visit (INDEPENDENT_AMBULATORY_CARE_PROVIDER_SITE_OTHER): Payer: Managed Care, Other (non HMO) | Admitting: Internal Medicine

## 2021-04-10 ENCOUNTER — Other Ambulatory Visit: Payer: Self-pay

## 2021-04-10 ENCOUNTER — Encounter: Payer: Self-pay | Admitting: Internal Medicine

## 2021-04-10 DIAGNOSIS — F3340 Major depressive disorder, recurrent, in remission, unspecified: Secondary | ICD-10-CM | POA: Diagnosis not present

## 2021-04-10 DIAGNOSIS — K2100 Gastro-esophageal reflux disease with esophagitis, without bleeding: Secondary | ICD-10-CM | POA: Diagnosis not present

## 2021-04-10 DIAGNOSIS — J4530 Mild persistent asthma, uncomplicated: Secondary | ICD-10-CM | POA: Diagnosis not present

## 2021-04-10 MED ORDER — DULOXETINE HCL 60 MG PO CPEP: 60.0000 mg | ORAL_CAPSULE | Freq: Every day | ORAL | 3 refills | Status: DC

## 2021-04-10 MED ORDER — ESCITALOPRAM OXALATE 20 MG PO TABS: 1.0000 | ORAL_TABLET | Freq: Every day | ORAL | 3 refills | Status: DC

## 2021-04-10 MED ORDER — ALBUTEROL SULFATE HFA 108 (90 BASE) MCG/ACT IN AERS: 2.0000 | INHALATION_SPRAY | Freq: Two times a day (BID) | RESPIRATORY_TRACT | 1 refills | Status: DC | PRN

## 2021-04-10 MED ORDER — MONTELUKAST SODIUM 10 MG PO TABS: ORAL_TABLET | ORAL | 3 refills | Status: DC

## 2021-04-10 MED ORDER — FLUTICASONE PROPIONATE HFA 110 MCG/ACT IN AERO: 2.0000 | INHALATION_SPRAY | Freq: Two times a day (BID) | RESPIRATORY_TRACT | 12 refills | Status: DC

## 2021-04-10 NOTE — Assessment & Plan Note (Signed)
Has some stress---marriage, in-laws, etc---but controlled with duloxetine/escitalopram

## 2021-04-10 NOTE — Progress Notes (Signed)
Subjective:    Patient ID: Eric Norris, male    DOB: Jan 09, 1986, 35 y.o.   MRN: [id number]  HPI Here for follow up of depression and asthma This visit occurred during the SARS-CoV-2 public health emergency.  Safety protocols were in place, including screening questions prior to the visit, additional usage of staff PPE, and extensive cleaning of exam room while observing appropriate contact time as indicated for disinfecting solutions.   Doing "great" Happy with his job---careful about wearing respirator and leave when fumes get bad Needs inhaler once in a while Takes the singulair and flovent regularly  Depression controlled on his medication Does notice if he misses a dose--will get "almost like a panic attack" Mood is good on it Some stress with wife's parents---haven't seen them in a while (and having arguments) Some marital stress last year--he actually stayed out of the house briefly (and they did couples therapy)  Continues on the acid blocker---has to take bid No heartburn or dysphagia  Current Outpatient Medications on File Prior to Visit  Medication Sig Dispense Refill   albuterol (VENTOLIN HFA) 108 (90 Base) MCG/ACT inhaler Inhale 2 puffs into the lungs 2 (two) times daily as needed for wheezing or shortness of breath. MUST SCHEDULE OFFICE VISIT 18 each 0   DULoxetine (CYMBALTA) 60 MG capsule TAKE 1 CAPSULE (60 MG TOTAL) BY MOUTH DAILY. NEEDS OFFICE VISIT 30 capsule 0   escitalopram (LEXAPRO) 20 MG tablet TAKE 1 TABLET BY MOUTH EVERY DAY 90 tablet 3   fluticasone (FLOVENT HFA) 110 MCG/ACT inhaler Inhale 2 puffs into the lungs 2 (two) times daily. With spacer.Rinse mouth after 1 Inhaler 12   montelukast (SINGULAIR) 10 MG tablet TAKE 1 TABLET BY MOUTH EVERYDAY AT BEDTIME 30 tablet 0   omeprazole (PRILOSEC) 20 MG capsule Take 1 capsule (20 mg total) by mouth 2 (two) times daily before a meal. 180 capsule 3   albuterol (PROVENTIL) (2.5 MG/3ML) 0.083% nebulizer solution TAKE  3 MILLILITERS (2.5 MG TOTAL) BY NEBULIZATION EVERY 6 (SIX) HOURS AS NEEDED. (Patient not taking: Reported on 04/10/2021) 75 mL 0   No current facility-administered medications on file prior to visit.    No Known Allergies  Past Medical History:  Diagnosis Date   Anxiety    Asthma    Cold-induced asthma    Depression    GERD (gastroesophageal reflux disease)    Migraine without aura    Von Willebrand disease (HCC)    Von Willebrand disease (HCC)     History reviewed. No pertinent surgical history.  Family History  Problem Relation Age of Onset   Ulcerative colitis Sister     Social History   Socioeconomic History   Marital status: Married    Spouse name: Not on file   Number of children: 2   Years of education: Not on file   Highest education level: Not on file  Occupational History   Occupation: Refinishing--bathtubs, counters, etc    Comment: [employer]  Tobacco Use   Smoking status: Never   Smokeless tobacco: Never  Substance and Sexual Activity   Alcohol use: Yes    Alcohol/week: 0.0 standard drinks   Drug use: No   Sexual activity: Not on file  Other Topics Concern   Not on file  Social History Narrative   Not on file   Social Determinants of Health   Financial Resource Strain: Not on file  Food Insecurity: Not on file  Transportation Needs: Not on file  Physical Activity: Not on file  Stress: Not on file  Social Connections: Not on file  Intimate Partner Violence: Not on file   Review of Systems Lost 60# last year---down to 240# Then more stress---and gained the weight right back Sleeping okay--noisy breathing at times but no apnea    Objective:   Physical Exam Constitutional:      Appearance: Normal appearance.  Cardiovascular:     Rate and Rhythm: Normal rate and regular rhythm.     Heart sounds: No murmur heard.   No gallop.  Pulmonary:     Effort: Pulmonary effort is normal.     Breath sounds: Normal breath  sounds. No rales.     Comments: Faint upper airway insp/exp wheeze Abdominal:     Palpations: Abdomen is soft.     Tenderness: There is no abdominal tenderness.  Musculoskeletal:     Cervical back: Neck supple.  Lymphadenopathy:     Cervical: No cervical adenopathy.  Neurological:     Mental Status: He is alert.  Psychiatric:        Mood and Affect: Mood normal.        Behavior: Behavior normal.           Assessment & Plan:

## 2021-04-10 NOTE — Assessment & Plan Note (Signed)
Fair control on flovent and singulair Has albuterol for prn

## 2021-04-10 NOTE — Assessment & Plan Note (Signed)
Controlled with bid omeprazole

## 2021-04-16 DIAGNOSIS — J4520 Mild intermittent asthma, uncomplicated: Secondary | ICD-10-CM

## 2021-04-16 NOTE — Telephone Encounter (Signed)
Sounds like it needs a PA but I have not received a request for that.

## 2021-04-22 NOTE — Telephone Encounter (Signed)
I do not see a referral for this patient. Please order when able to. Thank you.

## 2021-04-22 NOTE — Telephone Encounter (Signed)
Pt is asking about pulmonary referral.

## 2021-05-26 ENCOUNTER — Institutional Professional Consult (permissible substitution): Payer: Managed Care, Other (non HMO) | Admitting: Pulmonary Disease

## 2021-07-28 ENCOUNTER — Other Ambulatory Visit: Payer: Self-pay | Admitting: Internal Medicine

## 2021-09-03 ENCOUNTER — Other Ambulatory Visit: Payer: Self-pay | Admitting: Internal Medicine

## 2021-09-28 ENCOUNTER — Other Ambulatory Visit: Payer: Self-pay | Admitting: Internal Medicine

## 2021-12-05 ENCOUNTER — Other Ambulatory Visit: Payer: Self-pay | Admitting: Internal Medicine

## 2021-12-08 ENCOUNTER — Telehealth: Payer: Self-pay

## 2021-12-08 MED ORDER — ALBUTEROL SULFATE (2.5 MG/3ML) 0.083% IN NEBU: 2.5000 mg | INHALATION_SOLUTION | Freq: Four times a day (QID) | RESPIRATORY_TRACT | 0 refills | Status: DC | PRN

## 2021-12-08 MED ORDER — ALBUTEROL SULFATE HFA 108 (90 BASE) MCG/ACT IN AERS: INHALATION_SPRAY | RESPIRATORY_TRACT | 1 refills | Status: DC

## 2021-12-08 NOTE — Telephone Encounter (Signed)
See previous refill request.

## 2021-12-08 NOTE — Telephone Encounter (Signed)
[facility] Night - Client >>>Contains Verbal Order - Signature Required<<< TELEPHONE ADVICE RECORD AccessNurse Patient Name: Eric Norris Gender: Male DOB: 16-Jan-1986 Age: 36 Y 7 M 1 D Return Phone Number: [phone] (Primary), [phone] (Secondary) Address: City/ State/ Zip: [place]  [address] Client [facility] Night - Client Client Site [facility] - Night Provider Tillman Abide- MD Contact Type Call Who Is Calling Patient / Member / Family / Caregiver Call Type Triage / Clinical Relationship To Patient Self Return Phone Number [phone] (Primary) Chief Complaint BREATHING - shortness of breath or sounds breathless Reason for Call Symptomatic / Request for Health Information Initial Comment Caller states that he need a refill for his inhaler and having issues breathing. Translation No Nurse Assessment Nurse: Kennyth Arnold, RN, Kaitlyn Date/Time (Eastern Time): 12/05/2021 6:20:38 PM Confirm and document reason for call. If symptomatic, describe symptoms. ---The caller states he either needs a refill for Albuterol Inhaler or Albuterol for Nebulizer. The caller states that he feels SOB and is wheezing. The caller has not had Albuterol in 2 days. Does the patient have any new or worsening symptoms? ---Yes Will a triage be completed? ---Yes Related visit to physician within the last 2 weeks? ---No Does the PT have any chronic conditions? (i.e. diabetes, asthma, this includes High risk factors for pregnancy, etc.) ---Yes List chronic conditions. ---Asthma, GERD, Allergies, Anxiety, Depression Is this a behavioral health or substance abuse call? ---No Nurse: Kennyth Arnold, RN, Yvonna Alanis Date/Time (Eastern Time): 12/05/2021 6:25:13 PM Please select the assessment type ---Refill Does the patient have enough medication to last until the office opens? ---Unable to obtain loaner dose from Pharmacy Does the  client directives allow for assistance with medications after hours? ---Yes Was the medication filled within the last 6 months? ---Yes PLEASE NOTE: All timestamps contained within this report are represented as [place] Standard Time. CONFIDENTIALTY NOTICE: This fax transmission is intended only for the addressee. It contains information that is legally privileged, confidential or otherwise protected from use or disclosure. If you are not the intended recipient, you are strictly prohibited from reviewing, disclosing, copying using or disseminating any of this information or taking any action in reliance on or regarding this information. If you have received this fax in error, please notify us immediately by telephone so that we can arrange for its return to [place]. Phone: [phone], Toll-Free: [phone], Fax: [phone] Page: 2 of 4 Call Id: [id number] Nurse Assessment What is the name of the medication, dose and instructions as listed on the bottle? ---Albuterol INH 90 MCG Actuation: 2 Puffs Q4H PRN Name of the physician as listed on the bottle. ---Dr. Tillman Abide Pharmacy name and phone number where most recently filled. ---[facility] # [id number] Guidelines Guideline Title Affirmed Question Affirmed Notes Nurse Date/Time Lamount Cohen Time) Asthma Attack SEVERE asthma attack (e.g., struggling for each breath, speaks in single words, loud wheezes, pulse >120) (RED Zone) Kennyth Arnold, RN, Kaitlyn 12/05/2021 6:23:12 PM Disp. Time Lamount Cohen Time) Disposition Final User 12/05/2021 6:19:29 PM Send to Urgent Lanney Gins 12/05/2021 6:44:08 PM 911 Outcome Documentation Kennyth Arnold, RN, Yvonna Alanis Reason: Refused. 12/05/2021 6:44:22 PM Pharmacy Call Kennyth Arnold, RN, Yvonna Alanis Reason: See Med Applet 12/05/2021 6:44:43 PM Attempt made - message left Suzy Bouchard 12/05/2021 6:31:19 PM Call EMS 911 Now Yes Kennyth Arnold, RN, Milas Gain Disagree/Comply Disagree Caller Understands No PreDisposition  InappropriateToAsk Care Advice Given Per Guideline CALL EMS 911 NOW: * Immediate medical attention is needed. You need to hang  up and call 911 (or an ambulance). * Triager Discretion: I'll call you back in a few minutes to be sure you were able to reach them. CARE ADVICE given per Asthma Attack (Adult) guideline. Verbal Orders/Maintenance Medications Medication Refill Route Dosage Regime Duration Admin Instructions User Name Albuterol INH: 90 MCG Actuation Yes Inhaler 2 Puffs BID As Needed Albuterol INH: 2 Puffs BID Kennyth Arnold, RN, Kaitlyn PLEASE NOTE: All timestamps contained within this report are represented as [place] Standard Time. CONFIDENTIALTY NOTICE: This fax transmission is intended only for the addressee. It contains information that is legally privileged, confidential or otherwise protected from use or disclosure. If you are not the intended recipient, you are strictly prohibited from reviewing, disclosing, copying using or disseminating any of this information or taking any action in reliance on or regarding this information. If you have received this fax in error, please notify us immediately by telephone so that we can arrange for its return to [place]. Phone: [phone], Toll-Free: [phone], Fax: [phone] Page: 3 of 4 Call Id: [id number] Comments User: Toy Care, RN Date/Time Lamount Cohen Time): 12/05/2021 6:32:09 PM The caller states that he is not going to waste his money on an ED visit when he knows his symptoms will improve with the Albuterol INH. User: Toy Care, RN Date/Time Lamount Cohen Time): 12/05/2021 6:40:41 PM The caller stated that dosing was 2 Puffs Q4H PRN, but the Pharmacist stated the medication is prescribed as 2 Puffs BID. This RN told the pharmacist to fill the medication as prescribed by the doctor. The caller had thrown away empty Inhaler so was not able to look at dosing instructions. User: Toy Care, RN Date/Time Lamount Cohen Time): 12/05/2021  7:01:24 PM This RN contacted the caller and informed him that medication refill had been requested by this RN. The caller verbalized understanding. Referrals GO TO FACILITY REFUSE

## 2021-12-31 ENCOUNTER — Other Ambulatory Visit: Payer: Self-pay | Admitting: Internal Medicine

## 2021-12-31 NOTE — Telephone Encounter (Signed)
Refill request albuterol inhaler ?Last refill 12/08/21  18 each/1 refill ?Last office visit 04/10/21  ?Upcoming appointment 04/13/22 ?

## 2022-03-27 ENCOUNTER — Encounter (HOSPITAL_COMMUNITY): Payer: Self-pay | Admitting: Emergency Medicine

## 2022-03-27 ENCOUNTER — Ambulatory Visit (HOSPITAL_COMMUNITY)
Admission: EM | Admit: 2022-03-27 | Discharge: 2022-03-27 | Disposition: A | Discharge status: Home / Self Care | Payer: Managed Care, Other (non HMO) | Attending: Physician Assistant | Admitting: Physician Assistant

## 2022-03-27 DIAGNOSIS — K130 Diseases of lips: Secondary | ICD-10-CM

## 2022-03-27 MED ORDER — MUPIROCIN 2 % EX OINT: 1.0000 "application " | TOPICAL_OINTMENT | Freq: Two times a day (BID) | CUTANEOUS | 0 refills | Status: DC

## 2022-03-27 NOTE — ED Triage Notes (Signed)
Pt is present today with a bump on the corner of his mouth that he noticed today

## 2022-03-27 NOTE — ED Provider Notes (Signed)
[facility]    CSN: [id number] Arrival date & time: 03/27/22  1939      History   Chief Complaint Chief Complaint  Patient presents with   Rash    HPI Eric Norris is a 36 y.o. male.   Patient presents today with a mouth sore x1 day.  Reports household sick contacts (son) diagnosed with impetigo.  He denies history of recurrent cold sores or HSV-1 infection.  He has not tried any over-the-counter medication for symptom management.  Denies any fever, nausea, vomiting, cough, congestion.  Denies any recent antibiotics.  Denies changes to personal hygiene products including soaps, detergents, toothpaste, toothbrush.   Past Medical History:  Diagnosis Date   Anxiety    Asthma    Cold-induced asthma    Depression    GERD (gastroesophageal reflux disease)    Migraine without aura    Von Willebrand disease (HCC)    Von Willebrand disease (HCC)     Patient Active Problem List   Diagnosis Date Noted   Umbilical discharge 06/21/2020   Mild persistent asthma 06/14/2018   MDD (recurrent major depressive disorder) in remission (HCC) 11/29/2017   GAD (generalized anxiety disorder) 10/09/2015   Preventative health care 02/27/2015   GERD (gastroesophageal reflux disease)    Cold-induced asthma    Von Willebrand disease (HCC)    Migraine without aura     History reviewed. No pertinent surgical history.     Home Medications    Prior to Admission medications   Medication Sig Start Date End Date Taking? Authorizing Provider  mupirocin ointment (BACTROBAN) 2 % Apply 1 application. topically 2 (two) times daily. 03/27/22  Yes Jerris Fleer K, PA-C  albuterol (PROVENTIL) (2.5 MG/3ML) 0.083% nebulizer solution Take 3 mLs (2.5 mg total) by nebulization every 6 (six) hours as needed for wheezing or shortness of breath. 12/08/21   Karie Schwalbe, MD  albuterol (VENTOLIN HFA) 108 (90 Base) MCG/ACT inhaler INHALE 2 PUFFS INTO THE LUNGS 2 (TWO) TIMES DAILY AS NEEDED FOR  WHEEZING OR SHORTNESS OF BREATH. 12/31/21   Worthy Rancher B, FNP  DULoxetine (CYMBALTA) 60 MG capsule Take 1 capsule (60 mg total) by mouth daily. NEEDS OFFICE VISIT 04/10/21   Tillman Abide I, MD  escitalopram (LEXAPRO) 20 MG tablet Take 1 tablet (20 mg total) by mouth daily. 04/10/21   Karie Schwalbe, MD  fluticasone (FLOVENT HFA) 110 MCG/ACT inhaler Inhale 2 puffs into the lungs 2 (two) times daily. With spacer.Rinse mouth after 04/10/21   Tillman Abide I, MD  montelukast (SINGULAIR) 10 MG tablet TAKE 1 TABLET BY MOUTH EVERYDAY AT BEDTIME 04/10/21   Tillman Abide I, MD  omeprazole (PRILOSEC) 20 MG capsule TAKE 1 CAPSULE (20 MG TOTAL) BY MOUTH 2 (TWO) TIMES DAILY BEFORE A MEAL. 09/04/21   Karie Schwalbe, MD    Family History Family History  Problem Relation Age of Onset   Ulcerative colitis Sister     Social History Social History   Tobacco Use   Smoking status: Never   Smokeless tobacco: Never  Substance Use Topics   Alcohol use: Yes    Alcohol/week: 0.0 standard drinks   Drug use: No     Allergies   Patient has no known allergies.   Review of Systems Review of Systems  Constitutional:  Negative for activity change, appetite change, fatigue and fever.  HENT:  Negative for congestion, mouth sores, sinus pressure, sneezing and sore throat.   Gastrointestinal:  Negative for abdominal pain,  diarrhea, nausea and vomiting.    Physical Exam Triage Vital Signs ED Triage Vitals  Enc Vitals Group     BP 03/27/22 1952 (!) 150/74     Pulse Rate 03/27/22 1952 81     Resp 03/27/22 1952 18     Temp 03/27/22 1952 98.2 F (36.8 C)     Temp Source 03/27/22 1952 Oral     SpO2 03/27/22 1952 96 %     Weight --      Height --      Head Circumference --      Peak Flow --      Pain Score 03/27/22 1951 0     Pain Loc --      Pain Edu? --      Excl. in GC? --    No data found.  Updated Vital Signs BP (!) 150/74   Pulse 81   Temp 98.2 F (36.8 C) (Oral)   Resp 18    SpO2 96%   Visual Acuity Right Eye Distance:   Left Eye Distance:   Bilateral Distance:    Right Eye Near:   Left Eye Near:    Bilateral Near:     Physical Exam Vitals reviewed.  Constitutional:      General: He is awake.     Appearance: Normal appearance. He is well-developed. He is not ill-appearing.     Comments: Very pleasant male appears stated age in no acute distress sitting comfortably in exam room  HENT:     Head: Normocephalic and atraumatic.     Mouth/Throat:     Pharynx: Uvula midline. No oropharyngeal exudate, posterior oropharyngeal erythema or uvula swelling.     Comments: Ulcerated and irritated lesion at left oral commissure Cardiovascular:     Rate and Rhythm: Normal rate and regular rhythm.     Heart sounds: Normal heart sounds, S1 normal and S2 normal. No murmur heard. Pulmonary:     Effort: Pulmonary effort is normal.     Breath sounds: Normal breath sounds. No stridor. No wheezing, rhonchi or rales.     Comments: Clear to auscultation bilaterally Neurological:     Mental Status: He is alert.  Psychiatric:        Behavior: Behavior is cooperative.     UC Treatments / Results  Labs (all labs ordered are listed, but only abnormal results are displayed) Labs Reviewed - No data to display  EKG   Radiology No results found.  Procedures Procedures (including critical care time)  Medications Ordered in UC Medications - No data to display  Initial Impression / Assessment and Plan / UC Course  I have reviewed the triage vital signs and the nursing notes.  Pertinent labs & imaging results that were available during my care of the patient were reviewed by me and considered in my medical decision making (see chart for details).     Symptoms consistent with cheilitis.  Given concern for impetigo with household sick contacts will treat with mupirocin.  Discussed the importance of hygiene practices to manage symptoms.  He is to dispose of any lip  products and replace these.  Discussed that if he has spread of rash or additional symptoms he needs to be reevaluated.  Final Clinical Impressions(s) / UC Diagnoses   Final diagnoses:  Angular cheilitis     Discharge Instructions      Keep area clean with soap and water.  Apply mupirocin twice daily.  If you have spread of lesion or  additional symptoms you need to be reevaluated.     ED Prescriptions     Medication Sig Dispense Auth. Provider   mupirocin ointment (BACTROBAN) 2 % Apply 1 application. topically 2 (two) times daily. 22 g Sutton Plake K, PA-C      PDMP not reviewed this encounter.   Jeani Hawking, PA-C 03/27/22 2017

## 2022-03-27 NOTE — Discharge Instructions (Signed)
Keep area clean with soap and water.  Apply mupirocin twice daily.  If you have spread of lesion or additional symptoms you need to be reevaluated.

## 2022-04-13 ENCOUNTER — Ambulatory Visit: Payer: Managed Care, Other (non HMO) | Admitting: Internal Medicine

## 2022-04-30 ENCOUNTER — Other Ambulatory Visit: Payer: Self-pay | Admitting: Internal Medicine

## 2022-05-05 ENCOUNTER — Telehealth: Payer: Self-pay

## 2022-05-05 NOTE — Telephone Encounter (Signed)
The drug you are requesting prior authorization for is not covered. Please select an alternative drug from the choices provided and send in a new prescription for that drug.   ASMANEX TWISTHALER 30 DOSES  ASMANEX TWISTHALER 60 DOSES  ASMANEX TWISTHALER 120 DOSES  ASMANEX TWISTHALER  ASMANEX HFA INHALER 13GM  ASMANEX HFA INHALER 13GM  ARNUITY ELLIPTA INH 30 DOSES  ARNUITY ELLIPTA INH 30 DOSES 

## 2022-05-06 MED ORDER — ASMANEX HFA 200 MCG/ACT IN AERO: 1.0000 | INHALATION_SPRAY | Freq: Two times a day (BID) | RESPIRATORY_TRACT | 11 refills | Status: DC

## 2022-05-07 ENCOUNTER — Other Ambulatory Visit: Payer: Self-pay | Admitting: Internal Medicine

## 2022-05-08 ENCOUNTER — Telehealth: Payer: Self-pay

## 2022-05-08 NOTE — Telephone Encounter (Signed)
Prior auth started for Asmanex HFA 200MCG/ACT aerosol. Daisy Floro KeyMadelin Headings - [place] Case ID: [id number] - Rx #: [id number] Waiting for determination.

## 2022-05-19 NOTE — Telephone Encounter (Signed)
Prior auth for Asmanex HFA 200MCG/ACT aerosol has been denied. Daisy Floro Key[place]adelin Headings - [place] Case ID: [id number] - Rx #: [id number] Denial letter sent to scanning.  I have reviewed the request to cover Asmanex hfa 200 mcg HFA AER AD. The information submitted did not meet the criteria necessary to approve this medication.  Based on the information provided, I am unable to approve coverage for this medication because: There is nothing to support that for all of the following covered alternatives: brand Flovent Diskus/HFA, brand Qvar/Qvar Redihaler, brand Pulmicort Flexhaler - either: 1) The individual has had inadequate efficacy; or 2) The individual has a contraindication according to FDA label, significant intolerance, or is not a candidate* for the covered alternatives. *Not a candidate due to being subject to a warning per the prescribing information (labeling), having a disease characteristic, individual clinical factor(s), or other attributes/conditions or is unable to administer and requires this dosage formulation.  Alvesco, ArmonAir Digihaler, Arnuity Ellipta, Asmanex, and Asmanex HFA are considered medically necessary when there is documentation ofeitherof the following: A. The individual has had inadequate efficacy to all of thecovered alternatives:FloventDiskus/HFA, Qvar/QvarRedihaler, and Pulmicort Flexhaler; or B. The individual has a contraindication according to FDA label, significant intolerance, or is not a candidate* for the covered alternatives [*Not a candidate due to being subject to a warning per the prescribing information (labeling), having a disease characteristic, individual clinical factor(s), other attributes/conditions, or is unable to administer and requires this dosage formulation]I have reviewed the request to cover Asmanex hfa 200 mcg HFA AER AD. The information submitted did not meet the criteria necessary to approve this medication.Based on the information provided, I am  unable to approve coverage for this medication because:There is nothing to support that for all of the following covered alternatives: brand Flovent Diskus/HFA, brand Qvar/Qvar Redihaler, brand Pulmicort Flexhaler - either: 1) The individual has had inadequate efficacy; or 2) The individual has a contraindication according to FDA label, significant intolerance, or is not a candidate* for the covered alternatives. *Not a candidate due to being subject to a warning per the prescribing information (labeling), having a disease characteristic, individual clinical factor(s), or other attributes/conditions or is unable to administer and requires this dosage formulation.Alvesco, ArmonAir Digihaler, Arnuity Ellipta, Asmanex, and Asmanex HFA are considered medically necessary when there is documentation ofeitherof the following: A. The individual has had inadequate efficacy toall of thecovered alternatives:FloventDiskus/HFA, Qvar/QvarRedihaler, and Pulmicort Flexhaler; or B. The individual has a contraindication according to FDA label, significant intolerance, oris not a candidate* for the covered alternatives [*Not a candidate due to being subject to a warning per the prescribing information (labeling), having a disease characteristic, individual clinical factor(s), other attributes/conditions, or is unable to administer and requires this dosage formulation].

## 2022-05-20 NOTE — Telephone Encounter (Signed)
He was already on Flovent. The pharmacy sent [place] a note stating Flovent is not covered and to change it to one of these meds below. So, Dr Alphonsus Sias changed it to Asmanex. The pharmacy should just fill the Flovent rx he has on file.  Me   05/05/22  4:13 PM Note The drug you are requesting prior authorization for is not covered. Please select an alternative drug from the choices provided and send in a new prescription for that drug.   ASMANEX TWISTHALER 30 DOSES  ASMANEX TWISTHALER 60 DOSES  ASMANEX TWISTHALER 120 DOSES  ASMANEX TWISTHALER  ASMANEX HFA INHALER 13GM  ASMANEX HFA INHALER 13GM  ARNUITY ELLIPTA INH 30 DOSES  ARNUITY ELLIPTA INH 30 DOSES 

## 2022-05-26 ENCOUNTER — Other Ambulatory Visit: Payer: Self-pay | Admitting: Internal Medicine

## 2022-06-03 ENCOUNTER — Other Ambulatory Visit: Payer: Self-pay | Admitting: Internal Medicine

## 2022-06-03 ENCOUNTER — Encounter: Payer: Managed Care, Other (non HMO) | Admitting: Internal Medicine

## 2022-06-22 ENCOUNTER — Other Ambulatory Visit: Payer: Self-pay | Admitting: Internal Medicine

## 2022-07-06 ENCOUNTER — Other Ambulatory Visit: Payer: Self-pay | Admitting: Internal Medicine

## 2022-07-10 ENCOUNTER — Other Ambulatory Visit: Payer: Self-pay | Admitting: Internal Medicine

## 2022-07-13 ENCOUNTER — Other Ambulatory Visit: Payer: Self-pay | Admitting: Internal Medicine

## 2022-07-14 ENCOUNTER — Ambulatory Visit (INDEPENDENT_AMBULATORY_CARE_PROVIDER_SITE_OTHER): Payer: Managed Care, Other (non HMO) | Admitting: Internal Medicine

## 2022-07-14 ENCOUNTER — Encounter: Payer: Self-pay | Admitting: Internal Medicine

## 2022-07-14 DIAGNOSIS — J4531 Mild persistent asthma with (acute) exacerbation: Secondary | ICD-10-CM | POA: Diagnosis not present

## 2022-07-14 DIAGNOSIS — F3340 Major depressive disorder, recurrent, in remission, unspecified: Secondary | ICD-10-CM

## 2022-07-14 MED ORDER — PREDNISONE 20 MG PO TABS: 40.0000 mg | ORAL_TABLET | Freq: Every day | ORAL | 0 refills | Status: DC

## 2022-07-14 MED ORDER — AMOXICILLIN-POT CLAVULANATE 875-125 MG PO TABS: 1.0000 | ORAL_TABLET | Freq: Two times a day (BID) | ORAL | 0 refills | Status: DC

## 2022-07-14 NOTE — Assessment & Plan Note (Signed)
Has stress when working too much---some anhedonia Discussed pacing his schedule and maintaining time off On escitalopram 20 and duloxetine 60mg 

## 2022-07-14 NOTE — Progress Notes (Signed)
Subjective:    Patient ID: Eric Norris, male    DOB: [birth year], 36 y.o.   MRN: [id number]  HPI Here due to a respiratory infection  Has been sick for about 10 days Using OTC meds and cough drops Bad cough--with green/white mucus Feels dizzy and lightheaded More tired lately  No fever Some SOB Still on the asmanex bid Some wheezing--albuterol not helping that much  Mood has been pretty good Has noted decreased drive lately---not really depressed but some anhedonia Did go on cruise in May--felt great  Current Outpatient Medications on File Prior to Visit  Medication Sig Dispense Refill   albuterol (PROVENTIL) (2.5 MG/3ML) 0.083% nebulizer solution INHALE CONTENTS OF 1 VIAL BY NEBULIZATION EVERY 6 HRS AS NEEDED FOR WHEEZING OR SHORTNESS OF BREATH 75 mL 0   albuterol (VENTOLIN HFA) 108 (90 Base) MCG/ACT inhaler INHALE 2 PUFFS INTO THE LUNGS 2 TIMES DAILY AS NEEDED FOR WHEEZING OR SHORTNESS OF BREATH 8.5 each 0   DULoxetine (CYMBALTA) 60 MG capsule TAKE 1 CAPSULE (60 MG TOTAL) BY MOUTH DAILY. NEEDS OFFICE VISIT 90 capsule 0   escitalopram (LEXAPRO) 20 MG tablet TAKE 1 TABLET BY MOUTH EVERY DAY 90 tablet 0   Mometasone Furoate (ASMANEX HFA) 200 MCG/ACT AERO Inhale 1 puff into the lungs 2 (two) times daily. 13 g 11   montelukast (SINGULAIR) 10 MG tablet TAKE 1 TABLET BY MOUTH EVERYDAY AT BEDTIME 90 tablet 0   omeprazole (PRILOSEC) 20 MG capsule TAKE 1 CAPSULE (20 MG TOTAL) BY MOUTH 2 (TWO) TIMES DAILY BEFORE A MEAL. 180 capsule 2   No current facility-administered medications on file prior to visit.    Not on File  Past Medical History:  Diagnosis Date   Anxiety    Asthma    Cold-induced asthma    Depression    GERD (gastroesophageal reflux disease)    Migraine without aura    Von Willebrand disease ([place])    Von Willebrand disease ([place])     History reviewed. No pertinent surgical history.  Family History  Problem Relation Age of Onset   Ulcerative colitis Sister      Social History   Socioeconomic History   Marital status: Married    Spouse name: Not on file   Number of children: 2   Years of education: Not on file   Highest education level: Not on file  Occupational History   Occupation: Refinishing--bathtubs, counters, etc    Comment: Horticulturist, [employer]  Tobacco Use   Smoking status: Never    Passive exposure: Past   Smokeless tobacco: Never  Substance and Sexual Activity   Alcohol use: Yes    Alcohol/week: 0.0 standard drinks of alcohol   Drug use: No   Sexual activity: Not on file  Other Topics Concern   Not on file  Social History Narrative   Not on file   Social Determinants of Health   Financial Resource Strain: Not on file  Food Insecurity: Not on file  Transportation Needs: Not on file  Physical Activity: Not on file  Stress: Not on file  Social Connections: Not on file  Intimate Partner Violence: Not on file   Review of Systems Cough affecting sleep No chills or night sweats Hasn't missed work No N/V Appetite is down--but able to eat    Objective:   Physical Exam Constitutional:      Appearance: Normal appearance.  HENT:     Head:     Comments: No sinus  tenderness    Nose:     Comments: Moderate inflammation    Mouth/Throat:     Pharynx: No posterior oropharyngeal erythema.     Comments: Slight right palate ulcers Pulmonary:     Effort: Pulmonary effort is normal.     Breath sounds: No wheezing or rales.     Comments: Slightly decreased breath sounds Exp rhonchi Musculoskeletal:     Cervical back: Neck supple.  Lymphadenopathy:     Cervical: No cervical adenopathy.  Neurological:     Mental Status: He is alert.            Assessment & Plan:

## 2022-07-14 NOTE — Assessment & Plan Note (Signed)
With exacerbation Sick for 10 days Will treat with 5 days of prednisone augmentin 875 bid x 7 days Continue the asmanex bid

## 2022-08-04 ENCOUNTER — Encounter: Payer: Self-pay | Admitting: Internal Medicine

## 2022-08-11 ENCOUNTER — Ambulatory Visit (INDEPENDENT_AMBULATORY_CARE_PROVIDER_SITE_OTHER): Payer: Managed Care, Other (non HMO) | Admitting: Internal Medicine

## 2022-08-11 ENCOUNTER — Ambulatory Visit (INDEPENDENT_AMBULATORY_CARE_PROVIDER_SITE_OTHER)
Admission: RE | Admit: 2022-08-11 | Discharge: 2022-08-11 | Disposition: A | Discharge status: Home / Self Care | Payer: Managed Care, Other (non HMO) | Source: Ambulatory Visit | Attending: Internal Medicine | Admitting: Internal Medicine

## 2022-08-11 ENCOUNTER — Encounter: Payer: Self-pay | Admitting: Internal Medicine

## 2022-08-11 VITALS — BP 104/70 | HR 92 | Temp 98.0°F | Ht 73.0 in | Wt 295.0 lb

## 2022-08-11 DIAGNOSIS — J4531 Mild persistent asthma with (acute) exacerbation: Secondary | ICD-10-CM | POA: Diagnosis not present

## 2022-08-11 MED ORDER — PREDNISONE 20 MG PO TABS: 40.0000 mg | ORAL_TABLET | Freq: Every day | ORAL | 0 refills | Status: DC

## 2022-08-11 MED ORDER — DOXYCYCLINE HYCLATE 100 MG PO TABS: 100.0000 mg | ORAL_TABLET | Freq: Two times a day (BID) | ORAL | 0 refills | Status: DC

## 2022-08-11 NOTE — Assessment & Plan Note (Addendum)
Still not better Will check CXR--- looks normal (or at most some bronchial thickening)  Will try doxy 100 bid for a week Prednisone for longer time-- 40 x 5, then 20 x 5 Refer to pulmonary if not improving significantly by next week

## 2022-08-11 NOTE — Progress Notes (Signed)
Subjective:    Patient ID: Eric Norris, male    DOB: 1986-05-29, 36 y.o.   MRN: [id number]  HPI Here due to ongoing cough  Never really got better Cough with phlegm--green/white (day more than night) Feels the mucus in his chest--then he coughs No fever Some sweats/no chills  Some sense of SOB--but not bad Uses inhaler--and it helps Wheezing only when lying down at night--not SOB then  Current Outpatient Medications on File Prior to Visit  Medication Sig Dispense Refill   albuterol (PROVENTIL) (2.5 MG/3ML) 0.083% nebulizer solution INHALE CONTENTS OF 1 VIAL BY NEBULIZATION EVERY 6 HRS AS NEEDED FOR WHEEZING OR SHORTNESS OF BREATH 75 mL 0   albuterol (VENTOLIN HFA) 108 (90 Base) MCG/ACT inhaler INHALE 2 PUFFS INTO THE LUNGS 2 TIMES DAILY AS NEEDED FOR WHEEZING OR SHORTNESS OF BREATH 8.5 each 0   DULoxetine (CYMBALTA) 60 MG capsule TAKE 1 CAPSULE (60 MG TOTAL) BY MOUTH DAILY. NEEDS OFFICE VISIT 90 capsule 0   escitalopram (LEXAPRO) 20 MG tablet TAKE 1 TABLET BY MOUTH EVERY DAY 90 tablet 0   Mometasone Furoate (ASMANEX HFA) 200 MCG/ACT AERO Inhale 1 puff into the lungs 2 (two) times daily. 13 g 11   montelukast (SINGULAIR) 10 MG tablet TAKE 1 TABLET BY MOUTH EVERYDAY AT BEDTIME 90 tablet 0   omeprazole (PRILOSEC) 20 MG capsule TAKE 1 CAPSULE (20 MG TOTAL) BY MOUTH 2 (TWO) TIMES DAILY BEFORE A MEAL. 180 capsule 2   No current facility-administered medications on file prior to visit.    No Known Allergies  Past Medical History:  Diagnosis Date   Anxiety    Asthma    Cold-induced asthma    Depression    GERD (gastroesophageal reflux disease)    Migraine without aura    Von Willebrand disease ([facility])    Von Willebrand disease ([facility])     History reviewed. No pertinent surgical history.  Family History  Problem Relation Age of Onset   Ulcerative colitis Sister     Social History   Socioeconomic History   Marital status: Married    Spouse name: Not on file   Number of  children: 2   Years of education: Not on file   Highest education level: Not on file  Occupational History   Occupation: Refinishing--bathtubs, counters, etc    Comment: [employer]  Tobacco Use   Smoking status: Never    Passive exposure: Past   Smokeless tobacco: Never  Substance and Sexual Activity   Alcohol use: Yes    Alcohol/week: 0.0 standard drinks of alcohol   Drug use: No   Sexual activity: Not on file  Other Topics Concern   Not on file  Social History Narrative   Not on file   Social Determinants of Health   Financial Resource Strain: Not on file  Food Insecurity: Not on file  Transportation Needs: Not on file  Physical Activity: Not on file  Stress: Not on file  Social Connections: Not on file  Intimate Partner Violence: Not on file   Review of Systems No heartburn on the omeprazole Some post nasal drip Sometimes has pain with swallowing     Objective:   Physical Exam Constitutional:      Appearance: Normal appearance.  HENT:     Head:     Comments: No sinus tenderness    Nose:     Comments: Mild congestion    Mouth/Throat:     Pharynx: No oropharyngeal exudate  or posterior oropharyngeal erythema.  Pulmonary:     Effort: Pulmonary effort is normal.     Breath sounds: No wheezing or rales.     Comments: Decreased breath sounds but not tight Musculoskeletal:     Cervical back: Neck supple.  Lymphadenopathy:     Cervical: No cervical adenopathy.  Neurological:     Mental Status: He is alert.            Assessment & Plan:

## 2022-08-24 ENCOUNTER — Encounter: Payer: Managed Care, Other (non HMO) | Admitting: Internal Medicine

## 2022-09-04 ENCOUNTER — Other Ambulatory Visit: Payer: Self-pay | Admitting: Internal Medicine

## 2022-09-27 ENCOUNTER — Other Ambulatory Visit: Payer: Self-pay | Admitting: Internal Medicine

## 2022-10-05 ENCOUNTER — Other Ambulatory Visit: Payer: Self-pay | Admitting: Internal Medicine

## 2022-10-09 ENCOUNTER — Other Ambulatory Visit: Payer: Self-pay | Admitting: Internal Medicine

## 2022-10-09 ENCOUNTER — Encounter: Payer: Self-pay | Admitting: Internal Medicine

## 2022-10-12 ENCOUNTER — Other Ambulatory Visit: Payer: Self-pay | Admitting: Internal Medicine

## 2022-10-20 ENCOUNTER — Ambulatory Visit (INDEPENDENT_AMBULATORY_CARE_PROVIDER_SITE_OTHER): Payer: Managed Care, Other (non HMO) | Admitting: Internal Medicine

## 2022-10-20 ENCOUNTER — Other Ambulatory Visit: Payer: Self-pay | Admitting: Internal Medicine

## 2022-10-20 ENCOUNTER — Encounter: Payer: Self-pay | Admitting: Internal Medicine

## 2022-10-20 VITALS — BP 114/70 | HR 80 | Temp 98.1°F | Ht 73.0 in | Wt 309.0 lb

## 2022-10-20 DIAGNOSIS — F331 Major depressive disorder, recurrent, moderate: Secondary | ICD-10-CM | POA: Diagnosis not present

## 2022-10-20 NOTE — Progress Notes (Signed)
Subjective:    Patient ID: Eric Norris, male    DOB: 1985-11-25, 36 y.o.   MRN: [id number]  HPI Here due to worsening of depression  His depression has been worsening for some time Then his brother died last week Has lost all his motivation Has put on a lot of weight Not taking care of himself Only goes to work--and "that is is" No longer enjoying things in the garage or with kids  Has had random thoughts of dying--mostly due to wanting to be with his brother No active suicidal ideation  Current Outpatient Medications on File Prior to Visit  Medication Sig Dispense Refill   albuterol (PROVENTIL) (2.5 MG/3ML) 0.083% nebulizer solution INHALE CONTENTS OF 1 VIAL BY NEBULIZATION EVERY 6 HRS AS NEEDED FOR WHEEZING OR SHORTNESS OF BREATH 75 mL 0   albuterol (VENTOLIN HFA) 108 (90 Base) MCG/ACT inhaler INHALE 2 PUFFS INTO THE LUNGS 2 TIMES DAILY AS NEEDED FOR WHEEZING OR SHORTNESS OF BREATH 8.5 each 0   DULoxetine (CYMBALTA) 60 MG capsule TAKE 1 CAPSULE (60 MG TOTAL) BY MOUTH DAILY. NEEDS OFFICE VISIT 30 capsule 0   escitalopram (LEXAPRO) 20 MG tablet TAKE 1 TABLET BY MOUTH EVERY DAY 90 tablet 0   Mometasone Furoate (ASMANEX HFA) 200 MCG/ACT AERO Inhale 1 puff into the lungs 2 (two) times daily. 13 g 11   montelukast (SINGULAIR) 10 MG tablet TAKE 1 TABLET BY MOUTH EVERYDAY AT BEDTIME 30 tablet 0   omeprazole (PRILOSEC) 20 MG capsule TAKE 1 CAPSULE (20 MG TOTAL) BY MOUTH 2 (TWO) TIMES DAILY BEFORE A MEAL. 180 capsule 2   No current facility-administered medications on file prior to visit.    No Known Allergies  Past Medical History:  Diagnosis Date   Anxiety    Asthma    Cold-induced asthma    Depression    GERD (gastroesophageal reflux disease)    Migraine without aura    Von Willebrand disease ([facility])    Von Willebrand disease ([facility])     History reviewed. No pertinent surgical history.  Family History  Problem Relation Age of Onset   Ulcerative colitis Sister      Social History   Socioeconomic History   Marital status: Married    Spouse name: Not on file   Number of children: 2   Years of education: Not on file   Highest education level: Not on file  Occupational History   Occupation: Refinishing--bathtubs, counters, etc    Comment: [employer]  Tobacco Use   Smoking status: Never    Passive exposure: Past   Smokeless tobacco: Never  Substance and Sexual Activity   Alcohol use: Yes    Alcohol/week: 0.0 standard drinks of alcohol   Drug use: No   Sexual activity: Not on file  Other Topics Concern   Not on file  Social History Narrative   Not on file   Social Determinants of Health   Financial Resource Strain: Not on file  Food Insecurity: Not on file  Transportation Needs: Not on file  Physical Activity: Not on file  Stress: Not on file  Social Connections: Not on file  Intimate Partner Violence: Not on file   Review of Systems Sleeps well---often a lot in the day (especially since brother died)     Objective:   Physical Exam Constitutional:      Appearance: Normal appearance.  Neurological:     Mental Status: He is alert.  Psychiatric:  Comments: Normal speech and appearance Depressed with some constricted affect No SI            Assessment & Plan:

## 2022-10-20 NOTE — Assessment & Plan Note (Signed)
Had been worsening even before his brother's death No SI Will continue the duloxetine 60 and escitalopram 20 Consider aripiprazole or ritalin---but I think best idea is referral for psychiatry Will try to make that urgent since his brother's death weighs on him severely

## 2022-10-21 ENCOUNTER — Encounter: Payer: Self-pay | Admitting: *Deleted

## 2022-10-27 MED ORDER — DULOXETINE HCL 60 MG PO CPEP: 60.0000 mg | ORAL_CAPSULE | Freq: Every day | ORAL | 3 refills | Status: DC

## 2022-10-27 NOTE — Addendum Note (Signed)
Addended by: Pilar Grammes on: 10/27/2022 02:49 PM   Modules accepted: Orders

## 2022-10-28 ENCOUNTER — Encounter: Payer: Managed Care, Other (non HMO) | Admitting: Internal Medicine

## 2022-11-28 ENCOUNTER — Other Ambulatory Visit: Payer: Self-pay | Admitting: Internal Medicine

## 2022-12-22 ENCOUNTER — Other Ambulatory Visit: Payer: Self-pay | Admitting: Internal Medicine

## 2023-01-12 ENCOUNTER — Other Ambulatory Visit: Payer: Self-pay | Admitting: Family

## 2023-01-12 ENCOUNTER — Other Ambulatory Visit: Payer: Self-pay | Admitting: Internal Medicine

## 2023-01-17 ENCOUNTER — Other Ambulatory Visit: Payer: Self-pay | Admitting: Internal Medicine

## 2023-01-25 ENCOUNTER — Other Ambulatory Visit: Payer: Self-pay | Admitting: Internal Medicine

## 2023-02-04 ENCOUNTER — Encounter: Payer: Self-pay | Admitting: Internal Medicine

## 2023-02-04 MED ORDER — ASMANEX HFA 200 MCG/ACT IN AERO: 1.0000 | INHALATION_SPRAY | Freq: Two times a day (BID) | RESPIRATORY_TRACT | 11 refills | Status: DC

## 2023-03-10 ENCOUNTER — Other Ambulatory Visit: Payer: Self-pay | Admitting: Internal Medicine

## 2023-03-10 ENCOUNTER — Encounter: Payer: Self-pay | Admitting: Internal Medicine

## 2023-03-10 MED ORDER — QVAR REDIHALER 80 MCG/ACT IN AERB: 2.0000 | INHALATION_SPRAY | Freq: Two times a day (BID) | RESPIRATORY_TRACT | 11 refills | Status: DC

## 2023-03-11 NOTE — Telephone Encounter (Signed)
Number not in service will send my chart to patient to see if they requested refills.

## 2023-03-12 ENCOUNTER — Encounter: Payer: Self-pay | Admitting: Internal Medicine

## 2023-03-15 NOTE — Telephone Encounter (Signed)
This was taken care of in the refill request from the pharmacy.

## 2023-04-26 ENCOUNTER — Other Ambulatory Visit: Payer: Self-pay | Admitting: Internal Medicine

## 2023-05-07 ENCOUNTER — Other Ambulatory Visit: Payer: Self-pay | Admitting: Internal Medicine

## 2023-06-10 ENCOUNTER — Other Ambulatory Visit: Payer: Self-pay | Admitting: Internal Medicine

## 2023-06-21 ENCOUNTER — Other Ambulatory Visit: Payer: Self-pay | Admitting: Internal Medicine

## 2023-07-23 ENCOUNTER — Other Ambulatory Visit: Payer: Self-pay | Admitting: Internal Medicine

## 2023-09-29 ENCOUNTER — Other Ambulatory Visit: Payer: Self-pay | Admitting: Internal Medicine

## 2023-11-11 ENCOUNTER — Other Ambulatory Visit: Payer: Self-pay | Admitting: Internal Medicine

## 2023-11-11 NOTE — Telephone Encounter (Signed)
lvm for pt to call office to schedule

## 2023-11-11 NOTE — Telephone Encounter (Signed)
Refill request for albuterol (VENTOLIN HFA) 108 (90 Base) MCG/ACT inhaler   LOV - 10/20/22 Next OV - not scheduled Last refill - 06/10/23 #8.5/1

## 2023-11-11 NOTE — Telephone Encounter (Signed)
Patient is past due for appt; please call to set up appt.

## 2023-12-07 ENCOUNTER — Encounter: Payer: Self-pay | Admitting: Internal Medicine

## 2023-12-07 ENCOUNTER — Other Ambulatory Visit: Payer: Self-pay | Admitting: Internal Medicine

## 2023-12-07 NOTE — Telephone Encounter (Signed)
Pt sent in refill requests on Singulair, albuterol HFA and albuterol neb solution. I denied these refills as he has not been seen since 09/2022 and does have any scheduled appointments at this time.

## 2023-12-08 MED ORDER — ALBUTEROL SULFATE (2.5 MG/3ML) 0.083% IN NEBU: 2.5000 mg | INHALATION_SOLUTION | Freq: Three times a day (TID) | RESPIRATORY_TRACT | 0 refills | Status: DC | PRN

## 2023-12-08 MED ORDER — ALBUTEROL SULFATE HFA 108 (90 BASE) MCG/ACT IN AERS: INHALATION_SPRAY | RESPIRATORY_TRACT | 0 refills | Status: DC

## 2023-12-08 MED ORDER — MONTELUKAST SODIUM 10 MG PO TABS: ORAL_TABLET | ORAL | 0 refills | Status: DC

## 2023-12-08 NOTE — Addendum Note (Signed)
Addended by: Tillman Abide I on: 12/08/2023 08:39 AM   Modules accepted: Orders

## 2024-01-10 ENCOUNTER — Other Ambulatory Visit: Payer: Self-pay | Admitting: Internal Medicine

## 2024-01-10 NOTE — Telephone Encounter (Signed)
 Phone number no longer active. Sent mychart to schedule

## 2024-01-10 NOTE — Telephone Encounter (Signed)
 Pt needs to schedule for a CPE before refill will be sent. Please set him up and send back to me. Thank you.

## 2024-01-11 NOTE — Telephone Encounter (Signed)
 LVM for patient on spouse phone, informing to give [place] a call back.

## 2024-01-12 NOTE — Telephone Encounter (Signed)
 Patient is scheduled for tomorrow.

## 2024-01-12 NOTE — Telephone Encounter (Signed)
 Rx sent electronically.

## 2024-01-13 ENCOUNTER — Encounter: Payer: Self-pay | Admitting: Internal Medicine

## 2024-01-17 ENCOUNTER — Other Ambulatory Visit: Payer: Self-pay | Admitting: Internal Medicine

## 2024-01-27 ENCOUNTER — Encounter: Payer: Self-pay | Admitting: Internal Medicine

## 2024-02-08 ENCOUNTER — Encounter: Payer: Self-pay | Admitting: Internal Medicine

## 2024-02-08 ENCOUNTER — Ambulatory Visit (INDEPENDENT_AMBULATORY_CARE_PROVIDER_SITE_OTHER): Payer: Self-pay | Admitting: Internal Medicine

## 2024-02-08 VITALS — BP 118/70 | HR 78 | Temp 98.6°F | Ht 72.5 in | Wt 317.0 lb

## 2024-02-08 DIAGNOSIS — J4530 Mild persistent asthma, uncomplicated: Secondary | ICD-10-CM

## 2024-02-08 DIAGNOSIS — F331 Major depressive disorder, recurrent, moderate: Secondary | ICD-10-CM

## 2024-02-08 MED ORDER — MONTELUKAST SODIUM 10 MG PO TABS: ORAL_TABLET | ORAL | 3 refills | Status: AC

## 2024-02-08 MED ORDER — QVAR REDIHALER 80 MCG/ACT IN AERB: 2.0000 | INHALATION_SPRAY | Freq: Two times a day (BID) | RESPIRATORY_TRACT | 11 refills | Status: AC

## 2024-02-08 MED ORDER — ALBUTEROL SULFATE (2.5 MG/3ML) 0.083% IN NEBU: 2.5000 mg | INHALATION_SOLUTION | Freq: Three times a day (TID) | RESPIRATORY_TRACT | 0 refills | Status: DC | PRN

## 2024-02-08 MED ORDER — ALBUTEROL SULFATE HFA 108 (90 BASE) MCG/ACT IN AERS: INHALATION_SPRAY | RESPIRATORY_TRACT | 0 refills | Status: DC

## 2024-02-08 MED ORDER — DULOXETINE HCL 60 MG PO CPEP: 60.0000 mg | ORAL_CAPSULE | Freq: Every day | ORAL | 3 refills | Status: AC

## 2024-02-08 MED ORDER — ESCITALOPRAM OXALATE 20 MG PO TABS: 20.0000 mg | ORAL_TABLET | Freq: Every day | ORAL | 3 refills | Status: AC

## 2024-02-08 NOTE — Assessment & Plan Note (Signed)
 Has been controlled with the Q-var and prn albuterol Will refill

## 2024-02-08 NOTE — Assessment & Plan Note (Signed)
 Doing okay now Still sees Dr Bradford Cadet On duloxetine and escitalopram

## 2024-02-08 NOTE — Progress Notes (Signed)
 Subjective:    Patient ID: Eric Norris, male    DOB: [birth year], 38 y.o.   MRN: [id number]  HPI Here for follow up  Wife lost her job--so he has no health insurance---this is why he has missed visits Trying to get insurance through his job  Some stress and mild depression  Getting over the grieving for his brother No daily depression but doesn't have much energy  Current Outpatient Medications on File Prior to Visit  Medication Sig Dispense Refill   albuterol (PROVENTIL) (2.5 MG/3ML) 0.083% nebulizer solution Take 3 mLs (2.5 mg total) by nebulization 3 (three) times daily as needed for wheezing or shortness of breath. 75 mL 0   albuterol (VENTOLIN HFA) 108 (90 Base) MCG/ACT inhaler INHALE 2 PUFFS INTO THE LUNGS UP TO 2 TIMES DAILY AS NEEDED FOR WHEEZING/SHORTNESS OF BREATH 18 each 0   beclomethasone (QVAR REDIHALER) 80 MCG/ACT inhaler Inhale 2 puffs into the lungs 2 (two) times daily. 1 each 11   DULoxetine (CYMBALTA) 60 MG capsule Take 1 capsule (60 mg total) by mouth daily. 100 capsule 3   escitalopram (LEXAPRO) 20 MG tablet TAKE 1 TABLET BY MOUTH EVERY DAY 90 tablet 0   montelukast (SINGULAIR) 10 MG tablet TAKE 1 TABLET BY MOUTH EVERYDAY AT BEDTIME 30 tablet 0   omeprazole (PRILOSEC) 20 MG capsule TAKE 1 CAPSULE BY MOUTH 2 TIMES DAILY BEFORE A MEAL. 180 capsule 0   No current facility-administered medications on file prior to visit.    No Known Allergies  Past Medical History:  Diagnosis Date   Anxiety    Asthma    Cold-induced asthma    Depression    GERD (gastroesophageal reflux disease)    Migraine without aura    Von Willebrand disease ([facility])    Von Willebrand disease ([facility])     History reviewed. No pertinent surgical history.  Family History  Problem Relation Age of Onset   Ulcerative colitis Sister    Stroke Brother     Social History   Socioeconomic History   Marital status: Married    Spouse name: Not on file   Number of children: 2   Years of  education: Not on file   Highest education level: Not on file  Occupational History   Occupation: Refinishing--bathtubs, Counselling psychologist, etc    Comment: [employer]  Tobacco Use   Smoking status: Never    Passive exposure: Past   Smokeless tobacco: Never  Substance and Sexual Activity   Alcohol use: Yes    Alcohol/week: 0.0 standard drinks of alcohol   Drug use: No   Sexual activity: Not on file  Other Topics Concern   Not on file  Social History Narrative   Not on file   Social Drivers of Health   Financial Resource Strain: Not on file  Food Insecurity: Not on file  Transportation Needs: Not on file  Physical Activity: Not on file  Stress: Not on file  Social Connections: Not on file  Intimate Partner Violence: Not on file   Review of Systems Weight is up a few pounds Not exercising--but physically active at work Discussed cutting out the sugared drinks Occasional cough or wheeze---but generally not if he remembers his meds No SOB Sleeps okay mostly Bowels and bladder work fine Some knee pain--and feet (?plantar surface)     Objective:   Physical Exam Constitutional:      Appearance: Normal appearance.  Cardiovascular:     Rate and Rhythm:  Normal rate and regular rhythm.     Pulses: Normal pulses.     Heart sounds: No murmur heard.    No gallop.  Pulmonary:     Effort: Pulmonary effort is normal.     Breath sounds: Normal breath sounds. No wheezing or rales.  Abdominal:     Palpations: Abdomen is soft.     Tenderness: There is no abdominal tenderness.  Musculoskeletal:     Cervical back: Neck supple.     Right lower leg: No edema.     Left lower leg: No edema.     Comments: Preserved arches in feet---discussed new/better shoes for support  Lymphadenopathy:     Cervical: No cervical adenopathy.  Neurological:     Mental Status: He is alert.            Assessment & Plan:

## 2024-03-15 ENCOUNTER — Ambulatory Visit (HOSPITAL_COMMUNITY)
Admission: EM | Admit: 2024-03-15 | Discharge: 2024-03-15 | Disposition: A | Discharge status: Home / Self Care | Attending: Physician Assistant | Admitting: Physician Assistant

## 2024-03-15 ENCOUNTER — Ambulatory Visit (HOSPITAL_COMMUNITY): Payer: Self-pay | Admitting: Physician Assistant

## 2024-03-15 ENCOUNTER — Encounter (HOSPITAL_COMMUNITY): Payer: Self-pay | Admitting: Emergency Medicine

## 2024-03-15 ENCOUNTER — Other Ambulatory Visit: Payer: Self-pay

## 2024-03-15 ENCOUNTER — Ambulatory Visit (INDEPENDENT_AMBULATORY_CARE_PROVIDER_SITE_OTHER)

## 2024-03-15 DIAGNOSIS — R051 Acute cough: Secondary | ICD-10-CM

## 2024-03-15 DIAGNOSIS — J4541 Moderate persistent asthma with (acute) exacerbation: Secondary | ICD-10-CM

## 2024-03-15 LAB — POC COVID19/FLU A&B COMBO
Covid Antigen, POC: NEGATIVE
Influenza A Antigen, POC: NEGATIVE
Influenza B Antigen, POC: NEGATIVE

## 2024-03-15 MED ORDER — PREDNISONE 10 MG (21) PO TBPK: ORAL_TABLET | ORAL | 0 refills | Status: AC

## 2024-03-15 MED ORDER — IPRATROPIUM-ALBUTEROL 0.5-2.5 (3) MG/3ML IN SOLN
RESPIRATORY_TRACT | Status: AC
Start: 2024-03-15 — End: ?
  Filled 2024-03-15: qty 3

## 2024-03-15 MED ORDER — IPRATROPIUM-ALBUTEROL 0.5-2.5 (3) MG/3ML IN SOLN
3.0000 mL | Freq: Once | RESPIRATORY_TRACT | Status: AC
Start: 2024-03-15 — End: 2024-03-15
  Administered 2024-03-15: 3 mL via RESPIRATORY_TRACT

## 2024-03-15 MED ORDER — METHYLPREDNISOLONE SODIUM SUCC 125 MG IJ SOLR
INTRAMUSCULAR | Status: AC
  Filled 2024-03-15: qty 2

## 2024-03-15 MED ORDER — ALBUTEROL SULFATE (2.5 MG/3ML) 0.083% IN NEBU
INHALATION_SOLUTION | RESPIRATORY_TRACT | Status: AC
  Filled 2024-03-15: qty 3

## 2024-03-15 MED ORDER — METHYLPREDNISOLONE SODIUM SUCC 125 MG IJ SOLR
80.0000 mg | Freq: Once | INTRAMUSCULAR | Status: AC
  Administered 2024-03-15: 80 mg via INTRAMUSCULAR

## 2024-03-15 MED ORDER — ALBUTEROL SULFATE (2.5 MG/3ML) 0.083% IN NEBU
2.5000 mg | INHALATION_SOLUTION | Freq: Once | RESPIRATORY_TRACT | Status: AC
  Administered 2024-03-15: 2.5 mg via RESPIRATORY_TRACT

## 2024-03-15 MED ORDER — PROMETHAZINE-DM 6.25-15 MG/5ML PO SYRP
5.0000 mL | ORAL_SOLUTION | Freq: Two times a day (BID) | ORAL | 0 refills | Status: AC | PRN
Start: 2024-03-15 — End: ?

## 2024-03-15 NOTE — ED Triage Notes (Signed)
 Since Friday has been coughing up green phlegm.  Has had difficulty breathing.  Has been using inhaler and nebulizer.  Patient has had loose stools.  This morning, stool no longer loose.    Patient has a lot of head congestion.  Complains fo sore throat.  Reports a 100.3 fever prior to leaving home.  Patient took mucinex.  Has also tried dayguil, nyquil since Friday.

## 2024-03-15 NOTE — Discharge Instructions (Signed)
 We gave an injection of steroids today.  Please start prednisone  tomorrow (03/16/2024).  Do not take NSAIDs with this medication including aspirin , ibuprofen/Advil, naproxen/Aleve.  Use Tylenol , Mucinex, Flonase  for symptom relief.  Continue your albuterol  every 4-6 hours as needed.  If your symptoms are not improving within a few days or if anything worsens and you have wheezing/shortness of breath despite the medication, chest pain, weakness you need to go to the emergency room immediately.

## 2024-03-15 NOTE — ED Provider Notes (Signed)
 [facility]    CSN: [id number] Arrival date & time: 03/15/24  1028      History   Chief Complaint No chief complaint on file.   HPI Eric Norris is a 38 y.o. male.   Patient presents today with a 4-day history of URI including congestion, cough, shortness of breath, wheezing, chest tightness, fever, sore throat.  Denies any vomiting, diarrhea, body aches.  He has had some lightheadedness but denies any associated syncope.  He has tried Mucinex, DayQuil, NyQuil, 1 dose of Afrin which provided improvement in symptoms.  He reports that his wife was sick with similar symptoms but she has since improved.  He does have a history of asthma and has been using his Qvar  as maintenance and been using albuterol  more frequently.  Reports that despite the albuterol  he continues to have significant symptoms.  He denies any recent antibiotics or systemic steroids.  He has not had COVID recently.  He is having difficulty with daily cavities as result of symptoms.    Past Medical History:  Diagnosis Date   Anxiety    Asthma    Cold-induced asthma    Depression    GERD (gastroesophageal reflux disease)    Migraine without aura    Von Willebrand disease (HCC)    Von Willebrand disease (HCC)     Patient Active Problem List   Diagnosis Date Noted   Umbilical discharge 06/21/2020   Mild persistent asthma 06/14/2018   MDD (major depressive disorder), recurrent episode, moderate (HCC) 11/29/2017   GAD (generalized anxiety disorder) 10/09/2015   Preventative health care 02/27/2015   GERD (gastroesophageal reflux disease)    Von Willebrand disease (HCC)    Migraine without aura     History reviewed. No pertinent surgical history.     Home Medications    Prior to Admission medications   Medication Sig Start Date End Date Taking? Authorizing Provider  predniSONE  (STERAPRED UNI-PAK 21 TAB) 10 MG (21) TBPK tablet As directed 03/15/24  Yes Lanaysia Fritchman K, PA-C   promethazine-dextromethorphan (PROMETHAZINE-DM) 6.25-15 MG/5ML syrup Take 5 mLs by mouth 2 (two) times daily as needed for cough. 03/15/24  Yes Iraida Cragin K, PA-C  albuterol  (PROVENTIL ) (2.5 MG/3ML) 0.083% nebulizer solution Take 3 mLs (2.5 mg total) by nebulization 3 (three) times daily as needed for wheezing or shortness of breath. 02/08/24   Helaine Llanos, MD  albuterol  (VENTOLIN  HFA) 108 [phone] Base) MCG/ACT inhaler INHALE 2 PUFFS INTO THE LUNGS UP TO 2 TIMES DAILY AS NEEDED FOR WHEEZING/SHORTNESS OF BREATH 02/08/24   Helaine Llanos, MD  beclomethasone (QVAR  REDIHALER) 80 MCG/ACT inhaler Inhale 2 puffs into the lungs 2 (two) times daily. 02/08/24   Helaine Llanos, MD  DULoxetine  (CYMBALTA ) 60 MG capsule Take 1 capsule (60 mg total) by mouth daily. 02/08/24   Helaine Llanos, MD  escitalopram  (LEXAPRO ) 20 MG tablet Take 1 tablet (20 mg total) by mouth daily. 02/08/24   Helaine Llanos, MD  montelukast  (SINGULAIR ) 10 MG tablet TAKE 1 TABLET BY MOUTH EVERYDAY AT BEDTIME 02/08/24   Curt Dover I, MD  omeprazole  (PRILOSEC) 20 MG capsule TAKE 1 CAPSULE BY MOUTH 2 TIMES DAILY BEFORE A MEAL. 06/22/23   Helaine Llanos, MD    Family History Family History  Problem Relation Age of Onset   Ulcerative colitis Sister    Stroke Brother     Social History Social History   Tobacco Use   Smoking status: Never  Passive exposure: Past   Smokeless tobacco: Never  Vaping Use   Vaping status: Never Used  Substance Use Topics   Alcohol use: Yes    Alcohol/week: 0.0 standard drinks of alcohol   Drug use: No     Allergies   Patient has no known allergies.   Review of Systems Review of Systems  Constitutional:  Positive for activity change, fatigue and fever. Negative for appetite change.  HENT:  Positive for congestion and sore throat. Negative for sinus pressure and sneezing.   Respiratory:  Positive for cough, chest tightness, shortness of breath and wheezing.   Cardiovascular:   Negative for chest pain.  Gastrointestinal:  Positive for nausea. Negative for abdominal pain, diarrhea and vomiting.  Neurological:  Positive for light-headedness and headaches. Negative for dizziness.     Physical Exam Triage Vital Signs ED Triage Vitals  Encounter Vitals Group     BP 03/15/24 1054 116/64     Systolic BP Percentile --      Diastolic BP Percentile --      Pulse Rate 03/15/24 1054 (!) 102     Resp 03/15/24 1054 20     Temp 03/15/24 1054 99.1 F (37.3 C)     Temp Source 03/15/24 1054 Oral     SpO2 03/15/24 1054 92 %     Weight --      Height --      Head Circumference --      Peak Flow --      Pain Score 03/15/24 1100 7     Pain Loc --      Pain Education --      Exclude from Growth Chart --    No data found.  Updated Vital Signs BP (!) 123/56   Pulse (!) 102   Temp 98.6 F (37 C) (Oral)   Resp 14   SpO2 93%   Visual Acuity Right Eye Distance:   Left Eye Distance:   Bilateral Distance:    Right Eye Near:   Left Eye Near:    Bilateral Near:     Physical Exam Vitals reviewed.  Constitutional:      General: He is awake.     Appearance: Normal appearance. He is well-developed. He is not ill-appearing.     Comments: Very pleasant male appears stated age in no acute distress sitting comfortably in exam room  HENT:     Head: Normocephalic and atraumatic.     Right Ear: Tympanic membrane, ear canal and external ear normal. Tympanic membrane is not erythematous or bulging.     Left Ear: Tympanic membrane, ear canal and external ear normal. Tympanic membrane is not erythematous or bulging.     Nose: Nose normal.     Mouth/Throat:     Pharynx: Uvula midline. Posterior oropharyngeal erythema and postnasal drip present. No oropharyngeal exudate or uvula swelling.  Cardiovascular:     Rate and Rhythm: Normal rate and regular rhythm.     Heart sounds: Normal heart sounds, S1 normal and S2 normal. No murmur heard. Pulmonary:     Effort: Pulmonary effort  is normal. No accessory muscle usage or respiratory distress.     Breath sounds: No stridor. Examination of the right-lower field reveals rales. Examination of the left-lower field reveals rales. Wheezing and rales present. No rhonchi.  Neurological:     Mental Status: He is alert.  Psychiatric:        Behavior: Behavior is cooperative.      UC Treatments /  Results  Labs (all labs ordered are listed, but only abnormal results are displayed) Labs Reviewed  POC COVID19/FLU A&B COMBO    EKG   Radiology No results found.  Procedures Procedures (including critical care time)  Medications Ordered in UC Medications  ipratropium-albuterol  (DUONEB) 0.5-2.5 (3) MG/3ML nebulizer solution 3 mL (3 mLs Nebulization Given 03/15/24 1142)  methylPREDNISolone  sodium succinate (SOLU-MEDROL ) 125 mg/2 mL injection 80 mg (80 mg Intramuscular Given 03/15/24 1140)  albuterol  (PROVENTIL ) (2.5 MG/3ML) 0.083% nebulizer solution 2.5 mg (2.5 mg Nebulization Given 03/15/24 1228)    Initial Impression / Assessment and Plan / UC Course  I have reviewed the triage vital signs and the nursing notes.  Pertinent labs & imaging results that were available during my care of the patient were reviewed by me and considered in my medical decision making (see chart for details).     Patient was initially tachycardic but otherwise well-appearing and nontoxic.  She does have widespread wheezing on exam and so was given DuoNeb and Solu-Medrol  in clinic.  He continued to have wheezing and chest tightness and so was given a second dose of albuterol  in clinic with some improvement of symptoms.  After his second treatment his oxygen saturation was consistently above 95% (the time of discharge 98% with heart rate of 108).  He was negative for COVID and flu.  Chest x-ray was obtained that showed peribronchial thickening without focal consolidation based on my primary read.  At the time of discharge we were waiting for radiologist  overread and we will contact him if this differs and changes her treatment plan.  Will treat for asthma exacerbation and he does have nebulizer with albuterol  solution at home and was encouraged to use this every 4-6 hours as needed.  He will continue his maintenance Qvar  and will start prednisone  tomorrow (03/16/2024) with instruction not to take NSAIDs with this medication due to risk of GI bleeding.  He was given Promethazine DM for cough.  We discussed that this is sedating and he should not drive or drink alcohol taking it.  He is to obtain a pulse oximeter from the pharmacy and monitor his oxygen saturation with instruction to go to the ER with anything that drops below 90%.  Recommend close follow-up with his primary care.  We discussed that if he is not improving with this medication regimen or if anything worsens he will need to go to the emergency room.  Strict return precautions given.  Excuse note provided.  Final Clinical Impressions(s) / UC Diagnoses   Final diagnoses:  Acute cough  Moderate persistent asthma with acute exacerbation     Discharge Instructions      We gave an injection of steroids today.  Please start prednisone  tomorrow (03/16/2024).  Do not take NSAIDs with this medication including aspirin , ibuprofen/Advil, naproxen/Aleve.  Use Tylenol , Mucinex, Flonase  for symptom relief.  Continue your albuterol  every 4-6 hours as needed.  If your symptoms are not improving within a few days or if anything worsens and you have wheezing/shortness of breath despite the medication, chest pain, weakness you need to go to the emergency room immediately.   ED Prescriptions     Medication Sig Dispense Auth. Provider   predniSONE  (STERAPRED UNI-PAK 21 TAB) 10 MG (21) TBPK tablet As directed 21 tablet Myan Suit K, PA-C   promethazine-dextromethorphan (PROMETHAZINE-DM) 6.25-15 MG/5ML syrup Take 5 mLs by mouth 2 (two) times daily as needed for cough. 118 mL Lakendrick Paradis K, PA-C  PDMP not reviewed this encounter.   Budd Cargo, PA-C 03/15/24 1327

## 2024-06-05 ENCOUNTER — Encounter: Payer: Self-pay | Admitting: Internal Medicine

## 2024-06-14 ENCOUNTER — Other Ambulatory Visit: Payer: Self-pay | Admitting: Internal Medicine

## 2024-06-14 ENCOUNTER — Encounter: Payer: Self-pay | Admitting: Internal Medicine

## 2024-06-14 MED ORDER — ALBUTEROL SULFATE HFA 108 (90 BASE) MCG/ACT IN AERS: INHALATION_SPRAY | RESPIRATORY_TRACT | 0 refills | Status: DC

## 2024-06-14 NOTE — Addendum Note (Signed)
 Addended by: KALLIE CLOTILDA SQUIBB on: 06/14/2024 04:05 PM   Modules accepted: Orders

## 2024-07-11 ENCOUNTER — Telehealth: Payer: Self-pay

## 2024-07-11 ENCOUNTER — Other Ambulatory Visit: Payer: Self-pay | Admitting: Internal Medicine

## 2024-07-11 MED ORDER — OMEPRAZOLE 20 MG PO CPDR: 20.0000 mg | DELAYED_RELEASE_CAPSULE | Freq: Two times a day (BID) | ORAL | 0 refills | Status: AC

## 2024-07-11 NOTE — Telephone Encounter (Signed)
 Copied from CRM [id number]. Topic: Clinical - Medication Refill >> Jul 11, 2024  4:02 PM Shereese L wrote: Medication: omeprazole  (PRILOSEC) 20 MG capsule  Has the patient contacted their pharmacy? Yes (Agent: If no, request that the patient contact the pharmacy for the refill. If patient does not wish to contact the pharmacy document the reason why and proceed with request.) (Agent: If yes, when and what did the pharmacy advise?)  This is the patient's preferred pharmacy:  [id number] [place], [place] - [address] [place] [address] Phone: [phone] Fax: [phone]   Is this the correct pharmacy for this prescription? Yes If no, delete pharmacy and type the correct one.   Has the prescription been filled recently? Yes  Is the patient out of the medication? Yes  Has the patient been seen for an appointment in the last year OR does the patient have an upcoming appointment? Yes  Can we respond through MyChart? Yes  Agent: Please be advised that Rx refills may take up to 3 business days. We ask that you follow-up with your pharmacy.

## 2024-07-11 NOTE — Telephone Encounter (Unsigned)
 Copied from CRM [id number]. Topic: Clinical - Medication Refill >> Jul 11, 2024  4:02 PM Shereese L wrote: Medication: omeprazole  (PRILOSEC) 20 MG capsule  Has the patient contacted their pharmacy? Yes (Agent: If no, request that the patient contact the pharmacy for the refill. If patient does not wish to contact the pharmacy document the reason why and proceed with request.) (Agent: If yes, when and what did the pharmacy advise?)  This is the patient's preferred pharmacy:  [id number] [place], [place] - [address] [facility] [facility] [address] [place] [address] Phone: [phone] Fax: [phone]   Is this the correct pharmacy for this prescription? Yes If no, delete pharmacy and type the correct one.   Has the prescription been filled recently? Yes  Is the patient out of the medication? Yes  Has the patient been seen for an appointment in the last year OR does the patient have an upcoming appointment? Yes  Can we respond through MyChart? Yes  Agent: Please be advised that Rx refills may take up to 3 business days. We ask that you follow-up with your pharmacy.

## 2024-11-06 ENCOUNTER — Telehealth: Payer: Self-pay

## 2024-11-06 MED ORDER — ALBUTEROL SULFATE HFA 108 (90 BASE) MCG/ACT IN AERS: INHALATION_SPRAY | RESPIRATORY_TRACT | 0 refills | Status: AC

## 2024-11-06 MED ORDER — ALBUTEROL SULFATE (2.5 MG/3ML) 0.083% IN NEBU: INHALATION_SOLUTION | RESPIRATORY_TRACT | 0 refills | Status: AC

## 2024-11-06 NOTE — Telephone Encounter (Signed)
 Copied from CRM #[id number]. Topic: Clinical - Medication Question >> Nov 06, 2024 12:38 PM Sophia H wrote: Reason for CRM: Patient is needing refills on :  albuterol  (PROVENTIL ) (2.5 MG/3ML) 0.083% nebulizer solution albuterol  (VENTOLIN  HFA) 108 (90 Base) MCG/ACT inhaler  Was a patient of Dr. Marval, has a TOC scheduled with Dr. Bennett for March and need enough to get to that visit. TY    [id number] [place], [place] - [address]

## 2025-01-03 ENCOUNTER — Encounter
